# Patient Record
Sex: Female | Born: 1965
Health system: Southern US, Community
[De-identification: ages and names within clinical notes are randomized; demographics above are authoritative.]

## PROBLEM LIST (undated history)

## (undated) DIAGNOSIS — Z789 Other specified health status: Secondary | ICD-10-CM

## (undated) HISTORY — PX: BREAST CYST EXCISION: SHX579

---

## 2007-10-23 ENCOUNTER — Ambulatory Visit: Payer: Self-pay | Admitting: Family Medicine

## 2009-01-25 ENCOUNTER — Ambulatory Visit: Payer: Self-pay | Admitting: Family Medicine

## 2009-07-13 ENCOUNTER — Ambulatory Visit: Payer: Self-pay | Admitting: Family Medicine

## 2009-08-02 ENCOUNTER — Ambulatory Visit: Payer: Self-pay | Admitting: Family Medicine

## 2012-10-22 DIAGNOSIS — R928 Other abnormal and inconclusive findings on diagnostic imaging of breast: Secondary | ICD-10-CM | POA: Insufficient documentation

## 2013-05-05 ENCOUNTER — Ambulatory Visit: Payer: Self-pay | Admitting: Family Medicine

## 2015-02-24 ENCOUNTER — Ambulatory Visit: Payer: Self-pay | Admitting: Family Medicine

## 2015-02-24 ENCOUNTER — Encounter: Payer: Self-pay | Admitting: Family Medicine

## 2015-02-24 ENCOUNTER — Ambulatory Visit (INDEPENDENT_AMBULATORY_CARE_PROVIDER_SITE_OTHER): Payer: No Typology Code available for payment source | Admitting: Family Medicine

## 2015-02-24 VITALS — BP 110/64 | HR 76 | Ht 64.0 in | Wt 156.0 lb

## 2015-02-24 DIAGNOSIS — G44329 Chronic post-traumatic headache, not intractable: Secondary | ICD-10-CM

## 2015-02-24 DIAGNOSIS — M67442 Ganglion, left hand: Secondary | ICD-10-CM | POA: Diagnosis not present

## 2015-02-24 NOTE — Patient Instructions (Signed)

## 2015-02-24 NOTE — Progress Notes (Signed)
Name: Mallory Weber   MRN: [id number]    DOB: 08-06-1965   Date:02/24/2015       Progress Note  Subjective  Chief Complaint  Chief Complaint  Patient presents with  . Headache    in March hit head on water slide. Each time she has a headache- it seems to hurt in that spot.    Headache  This is a recurrent problem. The current episode started more than 1 month ago. The problem occurs intermittently. The problem has been unchanged. The pain is located in the left unilateral and temporal region. The pain does not radiate. The pain quality is not similar to prior headaches. The quality of the pain is described as aching. The pain is at a severity of 5/10. The pain is moderate. Associated symptoms include blurred vision. Pertinent negatives include no abdominal pain, abnormal behavior, anorexia, back pain, coughing, dizziness, ear pain, facial sweating, fever, hearing loss, insomnia, nausea, neck pain, numbness, phonophobia, scalp tenderness, seizures, sinus pressure, sore throat, tingling, tinnitus or weight loss. Nothing aggravates the symptoms. She has tried acetaminophen for the symptoms. The treatment provided mild relief. Her past medical history is significant for recent head traumas.  Hand Pain  There was no injury mechanism. The pain is present in the left fingers. The quality of the pain is described as aching. The pain is mild. Pertinent negatives include no chest pain, numbness or tingling. Nothing aggravates the symptoms. The treatment provided no relief.    No problem-specific assessment & plan notes found for this encounter.   No past medical history on file.  Past Surgical History  Procedure Laterality Date  . Breast cyst excision Bilateral     No family history on file.  Social History   Social History  . Marital Status: Married    Spouse Name: N/A  . Number of Children: N/A  . Years of Education: N/A   Occupational History  . Not on file.   Social History  Main Topics  . Smoking status: Never Smoker   . Smokeless tobacco: Not on file  . Alcohol Use: 0.0 oz/week    0 Standard drinks or equivalent per week  . Drug Use: No  . Sexual Activity: Not on file   Other Topics Concern  . Not on file   Social History Narrative  . No narrative on file    Allergies  Allergen Reactions  . Iodinated Diagnostic Agents Hives  . Cat Hair Extract Itching and Other (See Comments)    Also sneezing & eye irritation     Review of Systems  Constitutional: Negative for fever, chills, weight loss and malaise/fatigue.  HENT: Negative for ear discharge, ear pain, hearing loss, sinus pressure, sore throat and tinnitus.   Eyes: Positive for blurred vision.  Respiratory: Negative for cough, sputum production, shortness of breath and wheezing.   Cardiovascular: Negative for chest pain, palpitations and leg swelling.  Gastrointestinal: Negative for heartburn, nausea, abdominal pain, diarrhea, constipation, blood in stool, melena and anorexia.  Genitourinary: Negative for dysuria, urgency, frequency and hematuria.  Musculoskeletal: Negative for myalgias, back pain, joint pain and neck pain.  Skin: Negative for rash.  Neurological: Positive for headaches. Negative for dizziness, tingling, sensory change, focal weakness, seizures and numbness.  Endo/Heme/Allergies: Negative for environmental allergies and polydipsia. Does not bruise/bleed easily.  Psychiatric/Behavioral: Negative for depression and suicidal ideas. The patient is not nervous/anxious and does not have insomnia.      Objective  Filed Vitals:   02/24/15  1508  BP: 110/64  Pulse: 76  Height: 5\' 4"  (1.626 m)  Weight: 156 lb (70.761 kg)    Physical Exam  Constitutional: She is well-developed, well-nourished, and in no distress. No distress.  HENT:  Head: Normocephalic and atraumatic.  Right Ear: External ear normal.  Left Ear: External ear normal.  Nose: Nose normal.  Mouth/Throat:  Oropharynx is clear and moist.  Eyes: Conjunctivae and EOM are normal. Pupils are equal, round, and reactive to light. Right eye exhibits no discharge. Left eye exhibits no discharge.  Neck: Normal range of motion. Neck supple. No JVD present. No thyromegaly present.  Cardiovascular: Normal rate, regular rhythm, normal heart sounds and intact distal pulses.  Exam reveals no gallop and no friction rub.   No murmur heard. Pulmonary/Chest: Effort normal and breath sounds normal.  Abdominal: Soft. Bowel sounds are normal. She exhibits no mass. There is no tenderness. There is no guarding.  Musculoskeletal: Normal range of motion. She exhibits no edema.  Lymphadenopathy:    She has no cervical adenopathy.  Neurological: She is alert. She has normal reflexes.  Skin: Skin is warm and dry. She is not diaphoretic.  Psychiatric: Mood and affect normal.      Assessment & Plan  Problem List Items Addressed This Visit    None    Visit Diagnoses    Chronic post-traumatic headache, not intractable    -  Primary    Relevant Orders    CT Head Wo Contrast    Ganglion cyst of flexor tendon sheath of finger, left        obs/rtc prn         Dr. [facility]  02/24/2015

## 2015-02-27 ENCOUNTER — Ambulatory Visit
Admission: RE | Admit: 2015-02-27 | Discharge: 2015-02-27 | Disposition: A | Discharge status: Home / Self Care | Payer: No Typology Code available for payment source | Source: Ambulatory Visit | Attending: Family Medicine | Admitting: Family Medicine

## 2015-02-27 DIAGNOSIS — G44329 Chronic post-traumatic headache, not intractable: Secondary | ICD-10-CM | POA: Diagnosis present

## 2015-07-20 DIAGNOSIS — N950 Postmenopausal bleeding: Secondary | ICD-10-CM | POA: Insufficient documentation

## 2016-01-12 ENCOUNTER — Ambulatory Visit (INDEPENDENT_AMBULATORY_CARE_PROVIDER_SITE_OTHER): Payer: Managed Care, Other (non HMO) | Admitting: Family Medicine

## 2016-01-12 ENCOUNTER — Encounter: Payer: Self-pay | Admitting: Family Medicine

## 2016-01-12 VITALS — BP 120/70 | HR 64 | Ht 65.0 in | Wt 156.0 lb

## 2016-01-12 DIAGNOSIS — H6983 Other specified disorders of Eustachian tube, bilateral: Secondary | ICD-10-CM | POA: Diagnosis not present

## 2016-01-12 DIAGNOSIS — K1120 Sialoadenitis, unspecified: Secondary | ICD-10-CM

## 2016-01-12 DIAGNOSIS — S0300XD Dislocation of jaw, unspecified side, subsequent encounter: Secondary | ICD-10-CM

## 2016-01-12 MED ORDER — AZITHROMYCIN 250 MG PO TABS: ORAL_TABLET | ORAL | 0 refills | Status: DC

## 2016-01-12 MED ORDER — ETODOLAC 400 MG PO TABS: 400.0000 mg | ORAL_TABLET | Freq: Two times a day (BID) | ORAL | 3 refills | Status: DC

## 2016-01-12 NOTE — Patient Instructions (Signed)

## 2016-01-12 NOTE — Progress Notes (Signed)
Name: Mallory Weber   MRN: [id number]    DOB: 11-22-1965   Date:01/12/2016       Progress Note  Subjective  Chief Complaint  Chief Complaint  Patient presents with  . Jaw Pain    Hurting on both sides around the backside of jawbone. Pressure in ears- went to dentist/ nothing wrong with teeth    Otalgia   There is pain in both ears. This is a new problem. The current episode started 1 to 4 weeks ago. The problem occurs constantly. The problem has been gradually improving (now at persist level). There has been no fever. The pain is moderate. Pertinent negatives include no abdominal pain, coughing, diarrhea, ear discharge, headaches, hearing loss, neck pain, rash or sore throat. She has tried NSAIDs for the symptoms. The treatment provided mild relief. There is no history of a chronic ear infection or hearing loss.    No problem-specific Assessment & Plan notes found for this encounter.   History reviewed. No pertinent past medical history.  Past Surgical History:  Procedure Laterality Date  . BREAST CYST EXCISION Bilateral     History reviewed. No pertinent family history.  Social History   Social History  . Marital status: Married    Spouse name: N/A  . Number of children: N/A  . Years of education: N/A   Occupational History  . Not on file.   Social History Main Topics  . Smoking status: Never Smoker  . Smokeless tobacco: Not on file  . Alcohol use 0.0 oz/week  . Drug use: No  . Sexual activity: Not on file   Other Topics Concern  . Not on file   Social History Narrative  . No narrative on file    Allergies  Allergen Reactions  . Iodinated Diagnostic Agents Hives  . Cat Hair Extract Itching and Other (See Comments)    Also sneezing & eye irritation     Review of Systems  Constitutional: Negative for chills, fever, malaise/fatigue and weight loss.  HENT: Positive for ear pain. Negative for ear discharge, hearing loss and sore throat.   Eyes: Negative  for blurred vision.  Respiratory: Negative for cough, sputum production, shortness of breath and wheezing.   Cardiovascular: Negative for chest pain, palpitations and leg swelling.  Gastrointestinal: Negative for abdominal pain, blood in stool, constipation, diarrhea, heartburn, melena and nausea.  Genitourinary: Negative for dysuria, frequency, hematuria and urgency.  Musculoskeletal: Negative for back pain, joint pain, myalgias and neck pain.  Skin: Negative for rash.  Neurological: Negative for dizziness, tingling, sensory change, focal weakness and headaches.  Endo/Heme/Allergies: Negative for environmental allergies and polydipsia. Does not bruise/bleed easily.  Psychiatric/Behavioral: Negative for depression and suicidal ideas. The patient is not nervous/anxious and does not have insomnia.      Objective  Vitals:   01/12/16 1357  BP: 120/70  Pulse: 64  Weight: 156 lb (70.8 kg)  Height: 5\' 5"  (1.651 m)    Physical Exam  Constitutional: She is well-developed, well-nourished, and in no distress. No distress.  HENT:  Head: Normocephalic and atraumatic.  Right Ear: External ear normal.  Left Ear: External ear normal.  Nose: Nose normal.  Mouth/Throat: Oropharynx is clear and moist.  Tenderness bilateral YMJ  Eyes: Conjunctivae and EOM are normal. Pupils are equal, round, and reactive to light. Right eye exhibits no discharge. Left eye exhibits no discharge.  Neck: Normal range of motion. Neck supple. No JVD present. No thyromegaly present.  Cardiovascular: Normal rate, regular rhythm,  normal heart sounds and intact distal pulses.  Exam reveals no gallop and no friction rub.   No murmur heard. Pulmonary/Chest: Effort normal and breath sounds normal.  Abdominal: Soft. Bowel sounds are normal. She exhibits no mass. There is no tenderness. There is no guarding.  Musculoskeletal: Normal range of motion. She exhibits no edema.  Lymphadenopathy:    She has no cervical adenopathy.   Neurological: She is alert. She has normal reflexes.  Skin: Skin is warm and dry. She is not diaphoretic.  Psychiatric: Mood and affect normal.  Nursing note and vitals reviewed.     Assessment & Plan  Problem List Items Addressed This Visit    None    Visit Diagnoses    TMJ (dislocation of temporomandibular joint), subsequent encounter    -  Primary   Relevant Medications   etodolac (LODINE) 400 MG tablet   Eustachian tube dysfunction, bilateral       sudafed   Parotiditis       Relevant Medications   azithromycin (ZITHROMAX) 250 MG tablet        Dr. Deanna [facility]  01/12/16

## 2016-02-15 DIAGNOSIS — Z09 Encounter for follow-up examination after completed treatment for conditions other than malignant neoplasm: Secondary | ICD-10-CM | POA: Insufficient documentation

## 2017-03-06 ENCOUNTER — Other Ambulatory Visit: Payer: Self-pay | Admitting: Family Medicine

## 2017-03-06 ENCOUNTER — Encounter: Payer: Self-pay | Admitting: Family Medicine

## 2017-03-06 ENCOUNTER — Ambulatory Visit (INDEPENDENT_AMBULATORY_CARE_PROVIDER_SITE_OTHER): Payer: 59 | Admitting: Family Medicine

## 2017-03-06 VITALS — BP 100/62 | HR 80 | Ht 65.0 in | Wt 162.0 lb

## 2017-03-06 DIAGNOSIS — Z23 Encounter for immunization: Secondary | ICD-10-CM | POA: Diagnosis not present

## 2017-03-06 DIAGNOSIS — R002 Palpitations: Secondary | ICD-10-CM | POA: Diagnosis not present

## 2017-03-06 DIAGNOSIS — R635 Abnormal weight gain: Secondary | ICD-10-CM | POA: Diagnosis not present

## 2017-03-06 NOTE — Progress Notes (Signed)
Name: Mallory Weber   MRN: [id number]    DOB: 06-Oct-1965   Date:03/06/2017       Progress Note  Subjective  Chief Complaint  Chief Complaint  Patient presents with  . Neck Pain    felt like vein on L) side of neck was sore     Palpitations   This is a new problem. The current episode started 1 to 4 weeks ago (2 weeks ago). The problem occurs 2 to 4 times per day. The problem has been gradually improving. Exacerbated by: not ass caff  Pertinent negatives include no anxiety, chest fullness, chest pain, coughing, dizziness, fever, malaise/fatigue, nausea or shortness of breath. She has tried breathing exercises for the symptoms. There is no history of anemia, drug use, heart disease or hyperthyroidism.  Neck Pain   This is a new problem. The current episode started 1 to 4 weeks ago. The problem occurs intermittently. The pain is associated with a sleep position. The quality of the pain is described as aching. The pain is mild. The symptoms are aggravated by position. Pertinent negatives include no chest pain, fever, headaches, tingling or weight loss. She has tried nothing for the symptoms.    No problem-specific Assessment & Plan notes found for this encounter.   No past medical history on file.  Past Surgical History:  Procedure Laterality Date  . BREAST CYST EXCISION Bilateral     No family history on file.  Social History   Social History  . Marital status: Married    Spouse name: N/A  . Number of children: N/A  . Years of education: N/A   Occupational History  . Not on file.   Social History Main Topics  . Smoking status: Never Smoker  . Smokeless tobacco: Never Used  . Alcohol use 0.0 oz/week  . Drug use: No  . Sexual activity: Not on file   Other Topics Concern  . Not on file   Social History Narrative  . No narrative on file    Allergies  Allergen Reactions  . Iodinated Diagnostic Agents Hives  . Cat Hair Extract Itching and Other (See Comments)   Also sneezing & eye irritation    Outpatient Medications Prior to Visit  Medication Sig Dispense Refill  . Multiple Vitamin (MULTI-VITAMINS) TABS Take 1 tablet by mouth daily at 6 (six) AM.    . azithromycin (ZITHROMAX) 250 MG tablet 2 today then 1 a day for 4 day 6 tablet 0  . etodolac (LODINE) 400 MG tablet Take 1 tablet (400 mg total) by mouth 2 (two) times daily. 30 tablet 3   No facility-administered medications prior to visit.     Review of Systems  Constitutional: Negative for chills, fever, malaise/fatigue and weight loss.  HENT: Negative for ear discharge, ear pain and sore throat.   Eyes: Negative for blurred vision.  Respiratory: Negative for cough, sputum production, shortness of breath and wheezing.   Cardiovascular: Negative for chest pain, palpitations and leg swelling.  Gastrointestinal: Negative for abdominal pain, blood in stool, constipation, diarrhea, heartburn, melena and nausea.  Genitourinary: Negative for dysuria, frequency, hematuria and urgency.  Musculoskeletal: Positive for neck pain. Negative for back pain, joint pain and myalgias.  Skin: Negative for rash.  Neurological: Negative for dizziness, tingling, sensory change, focal weakness and headaches.  Endo/Heme/Allergies: Negative for environmental allergies and polydipsia. Does not bruise/bleed easily.  Psychiatric/Behavioral: Negative for depression and suicidal ideas. The patient is not nervous/anxious and does not have insomnia.  Objective  Vitals:   03/06/17 1030  BP: 100/62  Pulse: 80  Weight: 162 lb (73.5 kg)  Height: 5\' 5"  (1.651 m)    Physical Exam  Constitutional: She is well-developed, well-nourished, and in no distress. No distress.  HENT:  Head: Normocephalic and atraumatic.  Right Ear: External ear normal.  Left Ear: External ear normal.  Nose: Nose normal.  Mouth/Throat: Oropharynx is clear and moist.  Eyes: Pupils are equal, round, and reactive to light. Conjunctivae and  EOM are normal. Right eye exhibits no discharge. Left eye exhibits no discharge.  Neck: Normal range of motion. Neck supple. No JVD present. No thyromegaly present.  Cardiovascular: Normal rate, regular rhythm, S1 normal, S2 normal, intact distal pulses and normal pulses.  PMI is not displaced.  Exam reveals no gallop, no S3, no S4 and no friction rub.   Murmur heard.  Systolic murmur is present with a grade of 1/6  ? click  Pulmonary/Chest: Effort normal and breath sounds normal. She has no wheezes. She has no rales.  Abdominal: Soft. Bowel sounds are normal. She exhibits no mass. There is no tenderness. There is no guarding.  Musculoskeletal: Normal range of motion. She exhibits no edema.  Lymphadenopathy:    She has no cervical adenopathy.  Neurological: She is alert. She has normal reflexes.  Skin: Skin is warm and dry. She is not diaphoretic.  Psychiatric: Mood and affect normal.  Nursing note and vitals reviewed.     Assessment & Plan  Problem List Items Addressed This Visit    None    Visit Diagnoses    Palpitations    -  Primary   Relevant Orders   EKG 12-Lead (Completed)   TSH   Ambulatory referral to Cardiology   Weight gain       Relevant Orders   Renal Function Panel   Lipid Profile   Flu vaccine need       Relevant Orders   Flu Vaccine QUAD 6+ mos PF IM (Fluarix Quad PF) (Completed)      No orders of the defined types were placed in this encounter. I spent 30 minutes with this patient, More than 50% of that time was spent in face to face education, counseling and care coordination.    Dr. [facility]  03/06/17

## 2017-03-06 NOTE — Patient Instructions (Signed)
Mitral Valve Prolapse Mitral valve prolapse is a heart condition involving the mitral valve. This is the valve between the upper chamber (atrium) and the lower chamber (ventricle) on the left side of the heart. Normally, the mitral valve allows blood to flow from the atrium to the ventricle and then seals off the chambers from one another. If you have mitral valve prolapse, the valve does not work the way that it should. The flaps of the mitral valve do not form a tight seal between the atrium and the ventricle. When this happens, blood can flow the wrong way (mitral valve regurgitation). This causes symptoms of mitral valve prolapse. This condition can develop if:  The mitral valve flaps are larger and thicker than normal.  The valve opening stretches abnormally.  The valve flaps flop or bulge more than they should.  What are the causes? The cause of this condition is not known. However:  It is sometimes passed down (inherited) from a family member who also had the condition.  It can be a complication of other diseases.  What increases the risk? This condition is more like to develop in people with:  A family history of mitral valve prolapse.  Muscular dystrophy.  Graves disease.  Scoliosis.  A connective tissue disorder.  What are the signs or symptoms? Symptoms of this condition include:  A fast or irregular heartbeat (palpitations).  Fatigue.  Dizziness.  Shortness of breath.  Discomfort in the chest area.  In some cases, there are no symptoms for this condition. How is this diagnosed? This condition may be diagnosed based on:  Your symptoms and medical history.  A physical exam, which includes listening to your heart with a stethoscope.  Other tests to confirm the diagnosis. These may include imaging studies of your heart, such as: ? X-rays. These check for fluid in the lungs. ? Echocardiogram. This test uses sound waves to show the size of your heart and  how well it pumps. ? Doppler ultrasound. This test uses sound waves to take pictures of the blood flow through your valve. ? Electrocardiogram (ECG). This test records the electrical activity of your heart.  How is this treated? Treatment for this condition depends on how severe your symptoms are. If you need treatment, the goal is to relieve symptoms and prevent further problems, such as a type of heart infection called endocarditis. Treatment can include:  Medicines. You may need to take: ? Beta blockers. These help with chest discomfort and palpitations. ? Vasodilators. These improve forward blood flow through the valve, if it is leaking. ? Water pills (diuretics). These get rid of any extra fluid that is present.  Surgery to repair or replace the mitral valve.  If you do not have symptoms, you may not need treatment. Follow these instructions at home:  Take over-the-counter and prescription medicines only as told by your health care provider.  Get regular exercise. Ask your health care provider to recommend some activities that are safe for you to do.  Do not use any products that contain nicotine or tobacco, such as cigarettes and e-cigarettes. If you need help quitting, ask your health care provider.  Avoid being around secondhand smoke.  Brush and floss your teeth every day. See a dentist regularly. Having unhealthy teeth and gums may make endocarditis more likely.  Keep all follow-up visits as told by your health care provider. This is important. Contact a health care provider if:  You have any kind of abnormal heartbeat.  You are often very tired.  You have a cough that will not go away.  Your symptoms of mitral valve prolapse begin to get worse. Get help right away if:  You have chest pain or shortness of breath.  You start to have chills, body aches, and a fever. This information is not intended to replace advice given to you by your health care provider. Make  sure you discuss any questions you have with your health care provider. Document Released: 05/24/2000 Document Revised: 01/23/2016 Document Reviewed: 11/18/2015 Elsevier Interactive Patient Education  2017 [employer].

## 2017-03-07 LAB — RENAL FUNCTION PANEL
Albumin: 4.7 g/dL (ref 3.5–5.5)
BUN / CREAT RATIO: 17 (ref 9–23)
BUN: 13 mg/dL (ref 6–24)
CALCIUM: 9.6 mg/dL (ref 8.7–10.2)
CO2: 24 mmol/L (ref 20–29)
CREATININE: 0.75 mg/dL (ref 0.57–1.00)
Chloride: 101 mmol/L (ref 96–106)
GFR calc Af Amer: 105 mL/min/{1.73_m2} (ref >59)
GFR, EST NON AFRICAN AMERICAN: 91 mL/min/{1.73_m2} (ref >59)
Glucose: 94 mg/dL (ref 65–99)
Phosphorus: 3.7 mg/dL (ref 2.5–4.5)
Potassium: 4.8 mmol/L (ref 3.5–5.2)
SODIUM: 143 mmol/L (ref 134–144)

## 2017-03-07 LAB — LIPID PANEL
CHOL/HDL RATIO: 4 ratio (ref 0.0–4.4)
CHOLESTEROL TOTAL: 195 mg/dL (ref 100–199)
HDL: 49 mg/dL (ref >39)
LDL CALC: 117 mg/dL — AB (ref 0–99)
TRIGLYCERIDES: 143 mg/dL (ref 0–149)
VLDL CHOLESTEROL CAL: 29 mg/dL (ref 5–40)

## 2017-03-07 LAB — TSH: TSH: 1.83 u[IU]/mL (ref 0.450–4.500)

## 2017-04-24 ENCOUNTER — Other Ambulatory Visit: Payer: Self-pay | Admitting: Family Medicine

## 2018-07-16 DIAGNOSIS — Z803 Family history of malignant neoplasm of breast: Secondary | ICD-10-CM | POA: Insufficient documentation

## 2019-08-20 ENCOUNTER — Ambulatory Visit: Payer: No Typology Code available for payment source

## 2019-10-29 ENCOUNTER — Ambulatory Visit: Payer: 59 | Admitting: Family Medicine

## 2019-10-29 ENCOUNTER — Encounter: Payer: Self-pay | Admitting: Family Medicine

## 2019-10-29 ENCOUNTER — Other Ambulatory Visit: Payer: Self-pay

## 2019-10-29 VITALS — BP 120/64 | HR 64 | Ht 65.0 in | Wt 152.0 lb

## 2019-10-29 DIAGNOSIS — R5383 Other fatigue: Secondary | ICD-10-CM

## 2019-10-29 DIAGNOSIS — M5412 Radiculopathy, cervical region: Secondary | ICD-10-CM | POA: Diagnosis not present

## 2019-10-29 DIAGNOSIS — L659 Nonscarring hair loss, unspecified: Secondary | ICD-10-CM

## 2019-10-29 DIAGNOSIS — H6983 Other specified disorders of Eustachian tube, bilateral: Secondary | ICD-10-CM

## 2019-10-29 NOTE — Progress Notes (Signed)
Date:  10/29/2019   Name:  Mallory Weber   DOB:  1966/04/04   MRN:  [id number]   Chief Complaint: Alopecia (wants thyroid checked and cholesterol), ear pressure, and nerve pain (in L) arm)  Thyroid Problem Presents for initial visit. Symptoms include anxiety, dry skin, fatigue, hair loss and weight loss. Patient reports no cold intolerance, constipation, depressed mood, diaphoresis, diarrhea, heat intolerance, hoarse voice, menstrual problem, nail problem, palpitations, tremors, visual change or weight gain. The symptoms have been stable. Past treatments include nothing. The following procedures have not been performed: radioiodine uptake scan, thyroid FNA, thyroid ultrasound and thyroidectomy.  Otalgia  There is pain in the right (annoyanced) ear. This is a new problem. The current episode started in the past 7 days. The problem has been gradually improving. There has been no fever. The pain is mild. Pertinent negatives include no abdominal pain, coughing, diarrhea, ear discharge, headaches, hearing loss, neck pain, rash, rhinorrhea, sore throat or vomiting. There is no history of hearing loss.  Neurologic Problem The patient's primary symptoms include clumsiness. The patient's pertinent negatives include no altered mental status, focal sensory loss, focal weakness, loss of balance, memory loss, near-syncope, slurred speech, syncope, visual change or weakness. This is a new problem. The current episode started more than 1 month ago (6 months). The problem has been waxing and waning since onset. There was left-sided focality noted. Associated symptoms include dizziness and fatigue. Pertinent negatives include no abdominal pain, auditory change, aura, back pain, bladder incontinence, bowel incontinence, chest pain, confusion, diaphoresis, fever, headaches, light-headedness, nausea, neck pain, palpitations, shortness of breath, vertigo or vomiting. The treatment provided mild relief.   Anxiety Presents for initial visit. Symptoms include dizziness, irritability and nervous/anxious behavior. Patient reports no chest pain, confusion, depressed mood, excessive worry, insomnia, nausea, palpitations, panic or shortness of breath. Symptoms occur occasionally.      Lab Results  Component Value Date   CREATININE 0.75 03/06/2017   BUN 13 03/06/2017   NA 143 03/06/2017   K 4.8 03/06/2017   CL 101 03/06/2017   CO2 24 03/06/2017   Lab Results  Component Value Date   CHOL 195 03/06/2017   HDL 49 03/06/2017   LDLCALC 117 (H) 03/06/2017   TRIG 143 03/06/2017   CHOLHDL 4.0 03/06/2017   Lab Results  Component Value Date   TSH 1.830 03/06/2017   No results found for: HGBA1C No results found for: WBC, HGB, HCT, MCV, PLT No results found for: ALT, AST, GGT, ALKPHOS, BILITOT   Review of Systems  Constitutional: Positive for fatigue, irritability and weight loss. Negative for chills, diaphoresis, fever, unexpected weight change and weight gain.  HENT: Positive for ear pain. Negative for congestion, ear discharge, hearing loss, hoarse voice, rhinorrhea, sinus pressure, sneezing and sore throat.   Eyes: Negative for photophobia, pain, discharge, redness and itching.  Respiratory: Negative for cough, shortness of breath, wheezing and stridor.   Cardiovascular: Negative for chest pain, palpitations and near-syncope.  Gastrointestinal: Negative for abdominal pain, blood in stool, bowel incontinence, constipation, diarrhea, nausea and vomiting.  Endocrine: Negative for cold intolerance, heat intolerance, polydipsia, polyphagia and polyuria.  Genitourinary: Negative for bladder incontinence, dysuria, flank pain, frequency, hematuria, menstrual problem, pelvic pain, urgency, vaginal bleeding and vaginal discharge.  Musculoskeletal: Negative for arthralgias, back pain, myalgias and neck pain.  Skin: Negative for rash.  Allergic/Immunologic: Negative for environmental allergies and  food allergies.  Neurological: Positive for dizziness. Negative for vertigo, tremors, focal weakness, syncope, weakness,  light-headedness, numbness, headaches and loss of balance.  Hematological: Negative for adenopathy. Does not bruise/bleed easily.  Psychiatric/Behavioral: Negative for confusion, dysphoric mood and memory loss. The patient is nervous/anxious. The patient does not have insomnia.     There are no problems to display for this patient.   Allergies  Allergen Reactions  . Iodinated Diagnostic Agents Hives  . Cat Hair Extract Itching and Other (See Comments)    Also sneezing & eye irritation    Past Surgical History:  Procedure Laterality Date  . BREAST CYST EXCISION Bilateral     Social History   Tobacco Use  . Smoking status: Never Smoker  . Smokeless tobacco: Never Used  Substance Use Topics  . Alcohol use: Yes    Alcohol/week: 0.0 standard drinks  . Drug use: No     Medication list has been reviewed and updated.  Current Meds  Medication Sig  . Multiple Vitamin (MULTI-VITAMINS) TABS Take 1 tablet by mouth daily at 6 (six) AM.    PHQ 2/9 Scores 10/29/2019 03/06/2017 03/06/2017  PHQ - 2 Score 0 0 0  PHQ- 9 Score 3 1 -    BP Readings from Last 3 Encounters:  10/29/19 120/64  03/06/17 100/62  01/12/16 120/70    Physical Exam Vitals and nursing note reviewed.  Constitutional:      Appearance: She is well-developed.  HENT:     Head: Normocephalic.     Right Ear: Tympanic membrane, ear canal and external ear normal.     Left Ear: Tympanic membrane, ear canal and external ear normal.     Nose: Nose normal. No congestion or rhinorrhea.  Eyes:     General: Lids are everted, no foreign bodies appreciated. No scleral icterus.       Left eye: No foreign body or hordeolum.     Conjunctiva/sclera: Conjunctivae normal.     Right eye: Right conjunctiva is not injected.     Left eye: Left conjunctiva is not injected.     Pupils: Pupils are equal, round,  and reactive to light.  Neck:     Thyroid: No thyromegaly.     Vascular: No JVD.     Trachea: No tracheal deviation.  Cardiovascular:     Rate and Rhythm: Normal rate and regular rhythm.     Heart sounds: Normal heart sounds. No murmur. No friction rub. No gallop.   Pulmonary:     Effort: Pulmonary effort is normal. No respiratory distress.     Breath sounds: Normal breath sounds. No wheezing, rhonchi or rales.  Abdominal:     General: Bowel sounds are normal.     Palpations: Abdomen is soft. There is no mass.     Tenderness: There is no abdominal tenderness. There is no guarding or rebound.  Musculoskeletal:        General: No tenderness. Normal range of motion.     Cervical back: Normal range of motion and neck supple.  Lymphadenopathy:     Cervical: No cervical adenopathy.  Skin:    General: Skin is warm.     Capillary Refill: Capillary refill takes 2 to 3 seconds.     Coloration: Skin is pale.     Findings: No rash.  Neurological:     Mental Status: She is alert and oriented to person, place, and time.     Cranial Nerves: No cranial nerve deficit.     Deep Tendon Reflexes: Reflexes normal.  Psychiatric:        Mood and  Affect: Mood is not anxious or depressed.     Wt Readings from Last 3 Encounters:  10/29/19 152 lb (68.9 kg)  03/06/17 162 lb (73.5 kg)  01/12/16 156 lb (70.8 kg)    BP 120/64   Pulse 64   Ht 5\' 5"  (1.651 m)   Wt 152 lb (68.9 kg)   BMI 25.29 kg/m   Assessment and Plan: 1. Fatigue, unspecified type Chronic.  Persistent.  Patient is also noted other symptoms could be indicative of thyroid concern.  We will begin evaluation for fatigue work-up with CBC thyroid stimulating hormone hepatic function panel and renal function panel. - Renal function panel - Hepatic Function Panel (6) - TSH - CBC with Differential/Platelet  2. Alopecia Has been noted by the patient's physicians that there is thinning of her hair.  Patient has been using female dosing  of Rogaine for about a month and has not noticed significant results yet have encouraged patient is to continue at this point that it takes a couple months to see the full effect since early growth is bilious and not recognizable. - TSH  3. Cervical radiculopathy Patient's had discomfort going from the shoulder down her left arm associated with cervical pain this is consistent with radiculopathy.  Neurologic exam is unremarkable.  Patient may take anti-inflammatories as needed.  4. Dysfunction of both eustachian tubes Patient has noticed a heartbeat in her right ear.  Exam of both ears is unremarkable other than eustachian tube dysfunction manifested by retracted tympanic membranes.  There is no resolution when patient clears.  However this does continue I would suggest starting on nasal steroid.

## 2019-10-29 NOTE — Patient Instructions (Signed)
Eustachian Tube Dysfunction ° °Eustachian tube dysfunction refers to a condition in which a blockage develops in the narrow passage that connects the middle ear to the back of the nose (eustachian tube). The eustachian tube regulates air pressure in the middle ear by letting air move between the ear and nose. It also helps to drain fluid from the middle ear space. °Eustachian tube dysfunction can affect one or both ears. When the eustachian tube does not function properly, air pressure, fluid, or both can build up in the middle ear. °What are the causes? °This condition occurs when the eustachian tube becomes blocked or cannot open normally. Common causes of this condition include: °· Ear infections. °· Colds and other infections that affect the nose, mouth, and throat (upper respiratory tract). °· Allergies. °· Irritation from cigarette smoke. °· Irritation from stomach acid coming up into the esophagus (gastroesophageal reflux). The esophagus is the tube that carries food from the mouth to the stomach. °· Sudden changes in air pressure, such as from descending in an airplane or scuba diving. °· Abnormal growths in the nose or throat, such as: °? Growths that line the nose (nasal polyps). °? Abnormal growth of cells (tumors). °? Enlarged tissue at the back of the throat (adenoids). °What increases the risk? °You are more likely to develop this condition if: °· You smoke. °· You are overweight. °· You are a child who has: °? Certain birth defects of the mouth, such as cleft palate. °? Large tonsils or adenoids. °What are the signs or symptoms? °Common symptoms of this condition include: °· A feeling of fullness in the ear. °· Ear pain. °· Clicking or popping noises in the ear. °· Ringing in the ear. °· Hearing loss. °· Loss of balance. °· Dizziness. °Symptoms may get worse when the air pressure around you changes, such as when you travel to an area of high elevation, fly on an airplane, or go scuba diving. °How is  this diagnosed? °This condition may be diagnosed based on: °· Your symptoms. °· A physical exam of your ears, nose, and throat. °· Tests, such as those that measure: °? The movement of your eardrum (tympanogram). °? Your hearing (audiometry). °How is this treated? °Treatment depends on the cause and severity of your condition. °· In mild cases, you may relieve your symptoms by moving air into your ears. This is called "popping the ears." °· In more severe cases, or if you have symptoms of fluid in your ears, treatment may include: °? Medicines to relieve congestion (decongestants). °? Medicines that treat allergies (antihistamines). °? Nasal sprays or ear drops that contain medicines that reduce swelling (steroids). °? A procedure to drain the fluid in your eardrum (myringotomy). In this procedure, a small tube is placed in the eardrum to: °§ Drain the fluid. °§ Restore the air in the middle ear space. °? A procedure to insert a balloon device through the nose to inflate the opening of the eustachian tube (balloon dilation). °Follow these instructions at home: °Lifestyle °· Do not do any of the following until your health care provider approves: °? Travel to high altitudes. °? Fly in airplanes. °? Work in a pressurized cabin or room. °? Scuba dive. °· Do not use any products that contain nicotine or tobacco, such as cigarettes and e-cigarettes. If you need help quitting, ask your health care provider. °· Keep your ears dry. Wear fitted earplugs during showering and bathing. Dry your ears completely after. °General instructions °· Take over-the-counter   and prescription medicines only as told by your health care provider. °· Use techniques to help pop your ears as recommended by your health care provider. These may include: °? Chewing gum. °? Yawning. °? Frequent, forceful swallowing. °? Closing your mouth, holding your nose closed, and gently blowing as if you are trying to blow air out of your nose. °· Keep all  follow-up visits as told by your health care provider. This is important. °Contact a health care provider if: °· Your symptoms do not go away after treatment. °· Your symptoms come back after treatment. °· You are unable to pop your ears. °· You have: °? A fever. °? Pain in your ear. °? Pain in your head or neck. °? Fluid draining from your ear. °· Your hearing suddenly changes. °· You become very dizzy. °· You lose your balance. °Summary °· Eustachian tube dysfunction refers to a condition in which a blockage develops in the eustachian tube. °· It can be caused by ear infections, allergies, inhaled irritants, or abnormal growths in the nose or throat. °· Symptoms include ear pain, hearing loss, or ringing in the ears. °· Mild cases are treated with maneuvers to unblock the ears, such as yawning or ear popping. °· Severe cases are treated with medicines. Surgery may also be done (rare). °This information is not intended to replace advice given to you by your health care provider. Make sure you discuss any questions you have with your health care provider. °Document Revised: 09/16/2017 Document Reviewed: 09/16/2017 °Elsevier Patient Education © 2020 [employer] ° °

## 2019-10-30 LAB — CBC WITH DIFFERENTIAL/PLATELET
Basophils Absolute: 0 10*3/uL (ref 0.0–0.2)
Basos: 1 %
EOS (ABSOLUTE): 0.1 10*3/uL (ref 0.0–0.4)
Eos: 2 %
Hematocrit: 42.2 % (ref 34.0–46.6)
Hemoglobin: 13.7 g/dL (ref 11.1–15.9)
Immature Grans (Abs): 0 10*3/uL (ref 0.0–0.1)
Immature Granulocytes: 0 %
Lymphocytes Absolute: 1.2 10*3/uL (ref 0.7–3.1)
Lymphs: 29 %
MCH: 30.2 pg (ref 26.6–33.0)
MCHC: 32.5 g/dL (ref 31.5–35.7)
MCV: 93 fL (ref 79–97)
Monocytes Absolute: 0.4 10*3/uL (ref 0.1–0.9)
Monocytes: 8 %
Neutrophils Absolute: 2.6 10*3/uL (ref 1.4–7.0)
Neutrophils: 60 %
Platelets: 206 10*3/uL (ref 150–450)
RBC: 4.54 x10E6/uL (ref 3.77–5.28)
RDW: 12.8 % (ref 11.7–15.4)
WBC: 4.3 10*3/uL (ref 3.4–10.8)

## 2019-10-30 LAB — RENAL FUNCTION PANEL
Albumin: 4.6 g/dL (ref 3.8–4.9)
BUN/Creatinine Ratio: 17 (ref 9–23)
BUN: 13 mg/dL (ref 6–24)
CO2: 25 mmol/L (ref 20–29)
Calcium: 9.9 mg/dL (ref 8.7–10.2)
Chloride: 101 mmol/L (ref 96–106)
Creatinine, Ser: 0.78 mg/dL (ref 0.57–1.00)
GFR calc Af Amer: 98 mL/min/{1.73_m2} (ref >59)
GFR calc non Af Amer: 85 mL/min/{1.73_m2} (ref >59)
Glucose: 115 mg/dL — ABNORMAL HIGH (ref 65–99)
Phosphorus: 3.8 mg/dL (ref 3.0–4.3)
Potassium: 4.7 mmol/L (ref 3.5–5.2)
Sodium: 143 mmol/L (ref 134–144)

## 2019-10-30 LAB — HEPATIC FUNCTION PANEL (6)
ALT: 12 IU/L (ref 0–32)
AST: 16 IU/L (ref 0–40)
Alkaline Phosphatase: 60 IU/L (ref 48–121)
Bilirubin Total: 0.3 mg/dL (ref 0.0–1.2)
Bilirubin, Direct: 0.09 mg/dL (ref 0.00–0.40)

## 2019-10-30 LAB — TSH: TSH: 1.35 u[IU]/mL (ref 0.450–4.500)

## 2020-04-24 LAB — HM PAP SMEAR: HM Pap smear: NORMAL

## 2020-04-24 LAB — RESULTS CONSOLE HPV: CHL HPV: NEGATIVE

## 2020-08-15 DIAGNOSIS — M722 Plantar fascial fibromatosis: Secondary | ICD-10-CM | POA: Diagnosis not present

## 2020-09-05 DIAGNOSIS — M722 Plantar fascial fibromatosis: Secondary | ICD-10-CM | POA: Diagnosis not present

## 2020-09-05 DIAGNOSIS — B070 Plantar wart: Secondary | ICD-10-CM | POA: Diagnosis not present

## 2020-09-05 DIAGNOSIS — M79671 Pain in right foot: Secondary | ICD-10-CM | POA: Diagnosis not present

## 2020-09-19 ENCOUNTER — Encounter: Payer: Self-pay | Admitting: Family Medicine

## 2020-09-19 ENCOUNTER — Other Ambulatory Visit: Payer: Self-pay

## 2020-09-19 ENCOUNTER — Ambulatory Visit: Payer: BC Managed Care – PPO | Admitting: Family Medicine

## 2020-09-19 VITALS — BP 100/70 | HR 80 | Ht 65.0 in | Wt 158.0 lb

## 2020-09-19 DIAGNOSIS — D171 Benign lipomatous neoplasm of skin and subcutaneous tissue of trunk: Secondary | ICD-10-CM | POA: Diagnosis not present

## 2020-09-19 DIAGNOSIS — Z780 Asymptomatic menopausal state: Secondary | ICD-10-CM

## 2020-09-19 NOTE — Patient Instructions (Signed)
Lipoma  A lipoma is a noncancerous (benign) tumor that is made up of fat cells. This is a very common type of soft-tissue growth. Lipomas are usually found under the skin (subcutaneous). They may occur in any tissue of the body that contains fat. Common areas for lipomas to appear include the back, arms, shoulders, buttocks, and thighs. Lipomas grow slowly, and they are usually painless. Most lipomas do not cause problems and do not require treatment. What are the causes? The cause of this condition is not known. What increases the risk? You are more likely to develop this condition if:  You are 40-60 years old.  You have a family history of lipomas. What are the signs or symptoms? A lipoma usually appears as a small, round bump under the skin. In most cases, the lump will:  Feel soft or rubbery.  Not cause pain or other symptoms. However, if a lipoma is located in an area where it pushes on nerves, it can become painful or cause other symptoms. How is this diagnosed? A lipoma can usually be diagnosed with a physical exam. You may also have tests to confirm the diagnosis and to rule out other conditions. Tests may include:  Imaging tests, such as a CT scan or an MRI.  Removal of a tissue sample to be looked at under a microscope (biopsy). How is this treated? Treatment for this condition depends on the size of the lipoma and whether it is causing any symptoms.  For small lipomas that are not causing problems, no treatment is needed.  If a lipoma is bigger or it causes problems, surgery may be done to remove the lipoma. Lipomas can also be removed to improve appearance. Most often, the procedure is done after applying a medicine that numbs the area (local anesthetic).  Liposuction may be done to reduce the size of the lipoma before it is removed through surgery, or it may be done to remove the lipoma. Lipomas are removed with this method in order to limit incision size and scarring. A  liposuction tube is inserted through a small incision into the lipoma, and the contents of the lipoma are removed through the tube with suction. Follow these instructions at home:  Watch your lipoma for any changes.  Keep all follow-up visits as told by your health care provider. This is important. Contact a health care provider if:  Your lipoma becomes larger or hard.  Your lipoma becomes painful, red, or increasingly swollen. These could be signs of infection or a more serious condition. Get help right away if:  You develop tingling or numbness in an area near the lipoma. This could indicate that the lipoma is causing nerve damage. Summary  A lipoma is a noncancerous tumor that is made up of fat cells.  Most lipomas do not cause problems and do not require treatment.  If a lipoma is bigger or it causes problems, surgery may be done to remove the lipoma.  Contact a health care provider if your lipoma becomes larger or hard, or if it becomes painful, red, or increasingly swollen. Pain, redness, and swelling could be signs of infection or a more serious condition. This information is not intended to replace advice given to you by your health care provider. Make sure you discuss any questions you have with your health care provider. Document Revised: 01/11/2019 Document Reviewed: 01/11/2019 Elsevier Patient Education  2021 Else[employer] Inc.  

## 2020-09-19 NOTE — Progress Notes (Signed)
Date:  09/19/2020   Name:  Mallory Weber   DOB:  1965/06/19   MRN:  [id number]   Chief Complaint: lump on back (Feels like they are "cysts"- mobile and thinks they are pressing on something)  Leg Pain  The incident occurred more than 1 week ago. There was no injury mechanism. The pain is present in the left foot. The pain is moderate. The pain has been intermittent since onset. Pertinent negatives include no inability to bear weight, loss of motion, loss of sensation, muscle weakness, numbness or tingling. She reports no foreign bodies present. The treatment provided mild relief.    Lab Results  Component Value Date   CREATININE 0.78 10/29/2019   BUN 13 10/29/2019   NA 143 10/29/2019   K 4.7 10/29/2019   CL 101 10/29/2019   CO2 25 10/29/2019   Lab Results  Component Value Date   CHOL 195 03/06/2017   HDL 49 03/06/2017   LDLCALC 117 (H) 03/06/2017   TRIG 143 03/06/2017   CHOLHDL 4.0 03/06/2017   Lab Results  Component Value Date   TSH 1.350 10/29/2019   No results found for: HGBA1C Lab Results  Component Value Date   WBC 4.3 10/29/2019   HGB 13.7 10/29/2019   HCT 42.2 10/29/2019   MCV 93 10/29/2019   PLT 206 10/29/2019   Lab Results  Component Value Date   ALT 12 10/29/2019   AST 16 10/29/2019   ALKPHOS 60 10/29/2019   BILITOT 0.3 10/29/2019     Review of Systems  Constitutional: Negative.  Negative for chills, fatigue, fever and unexpected weight change.  HENT: Negative for congestion, ear discharge, ear pain, rhinorrhea, sinus pressure, sneezing and sore throat.   Eyes: Negative for photophobia, pain, discharge, redness and itching.  Respiratory: Negative for cough, shortness of breath, wheezing and stridor.   Cardiovascular: Negative for chest pain, palpitations and leg swelling.  Gastrointestinal: Negative for abdominal pain, blood in stool, constipation, diarrhea, nausea and vomiting.  Endocrine: Negative for cold intolerance, heat intolerance,  polydipsia, polyphagia and polyuria.  Genitourinary: Negative for dysuria, flank pain, frequency, hematuria, menstrual problem, pelvic pain, urgency, vaginal bleeding and vaginal discharge.  Musculoskeletal: Negative for arthralgias, back pain and myalgias.  Skin: Negative for rash.  Allergic/Immunologic: Negative for environmental allergies and food allergies.  Neurological: Negative for dizziness, tingling, weakness, light-headedness, numbness and headaches.  Hematological: Negative for adenopathy. Does not bruise/bleed easily.  Psychiatric/Behavioral: Negative for dysphoric mood. The patient is not nervous/anxious.     There are no problems to display for this patient.   Allergies  Allergen Reactions  . Iodinated Diagnostic Agents Hives  . Cat Hair Extract Itching and Other (See Comments)    Also sneezing & eye irritation    Past Surgical History:  Procedure Laterality Date  . BREAST CYST EXCISION Bilateral     Social History   Tobacco Use  . Smoking status: Never Smoker  . Smokeless tobacco: Never Used  Substance Use Topics  . Alcohol use: Yes    Alcohol/week: 0.0 standard drinks  . Drug use: No     Medication list has been reviewed and updated.  Current Meds  Medication Sig  . Calcium Carb-Cholecalciferol (CALCIUM 1000 + D PO) Take 1 capsule by mouth daily.  . Minoxidil (ROGAINE WOMENS EX) Apply topically. otc  . Multiple Vitamin (MULTI-VITAMINS) TABS Take 1 tablet by mouth daily at 6 (six) AM.    PHQ 2/9 Scores 09/19/2020 10/29/2019 03/06/2017 03/06/2017  PHQ -  2 Score 0 0 0 0  PHQ- 9 Score 0 3 1 -    GAD 7 : Generalized Anxiety Score 09/19/2020 10/29/2019  Nervous, Anxious, on Edge 0 0  Control/stop worrying 1 0  Worry too much - different things 1 0  Trouble relaxing 0 0  Restless 0 0  Easily annoyed or irritable 0 0  Afraid - awful might happen 0 0  Total GAD 7 Score 2 0  Anxiety Difficulty Not difficult at all -    BP Readings from Last 3 Encounters:   09/19/20 100/70  10/29/19 120/64  03/06/17 100/62    Physical Exam Vitals and nursing note reviewed.  Constitutional:      Appearance: She is well-developed.  HENT:     Head: Normocephalic.     Right Ear: Tympanic membrane, ear canal and external ear normal. There is no impacted cerumen.     Left Ear: Tympanic membrane, ear canal and external ear normal. There is no impacted cerumen.     Nose: Nose normal.     Mouth/Throat:     Mouth: Mucous membranes are moist.  Eyes:     General: Lids are everted, no foreign bodies appreciated. No scleral icterus.       Left eye: No foreign body or hordeolum.     Conjunctiva/sclera: Conjunctivae normal.     Right eye: Right conjunctiva is not injected.     Left eye: Left conjunctiva is not injected.     Pupils: Pupils are equal, round, and reactive to light.  Neck:     Thyroid: No thyromegaly.     Vascular: No JVD.     Trachea: No tracheal deviation.  Cardiovascular:     Rate and Rhythm: Normal rate and regular rhythm.     Heart sounds: Normal heart sounds. No murmur heard. No friction rub. No gallop.   Pulmonary:     Effort: Pulmonary effort is normal. No respiratory distress.     Breath sounds: Normal breath sounds. No wheezing, rhonchi or rales.  Abdominal:     General: Bowel sounds are normal.     Palpations: Abdomen is soft. There is no mass.     Tenderness: There is no abdominal tenderness. There is no guarding or rebound.  Musculoskeletal:        General: No tenderness. Normal range of motion.     Cervical back: Normal range of motion and neck supple.       Back:     Comments: Two palpable areas /nontender/ smooth smaller superior/1.5 cm//lower 3 cm  Lymphadenopathy:     Cervical: No cervical adenopathy.  Skin:    General: Skin is warm.     Findings: No rash.  Neurological:     Mental Status: She is alert and oriented to person, place, and time.     Cranial Nerves: No cranial nerve deficit.     Deep Tendon Reflexes:  Reflexes normal.  Psychiatric:        Mood and Affect: Mood is not anxious or depressed.     Wt Readings from Last 3 Encounters:  09/19/20 158 lb (71.7 kg)  10/29/19 152 lb (68.9 kg)  03/06/17 162 lb (73.5 kg)    BP 100/70   Pulse 80   Ht 5\' 5"  (1.651 m)   Wt 158 lb (71.7 kg)   BMI 26.29 kg/m   Assessment and Plan:  1. Postmenopausal Chronic.  Controlled.  Stable.  Patient is currently having some foot discomfort followed by podiatry.  Apparently there was a x-ray that demonstrated some decrease in mineralization suggesting maybe osteopenia versus osteoporosis.  We will obtain a bone density in the meantime I have discussed increasing vitamin D to 800 units supplementation and including increase of calcium to 1200 mg as well. - DG Bone Density; Future  2. Lipoma of torso Patient has noticed an area that is consistent with a lipoma in the lumbar torso area.  There are 2 larger 1 more inferior to a smaller 1.  They are palpable and nontender but they are causing discomfort to the patient pending her positioning.  She would like to seek evaluation of these with possible removal and I have recommended general surgery for this she will be calling me with a suggested surgeon and we will proceed with referral at that time.

## 2020-09-20 ENCOUNTER — Telehealth: Payer: Self-pay

## 2020-09-20 DIAGNOSIS — Z0000 Encounter for general adult medical examination without abnormal findings: Secondary | ICD-10-CM | POA: Diagnosis not present

## 2020-09-20 DIAGNOSIS — D171 Benign lipomatous neoplasm of skin and subcutaneous tissue of trunk: Secondary | ICD-10-CM

## 2020-09-20 DIAGNOSIS — Z90710 Acquired absence of both cervix and uterus: Secondary | ICD-10-CM | POA: Insufficient documentation

## 2020-09-20 NOTE — Telephone Encounter (Signed)
Copied from [place] [phone]. Topic: Referral - Request for Referral >> Sep 20, 2020  1:58 PM Celene Kras wrote: Has patient seen PCP for this complaint? Yes.   *If NO, is insurance requiring patient see PCP for this issue before PCP can refer them? Referral for which specialty: Mammography Preferred provider/office: [facility]  Reason for referral: Pt states that she is needing to have a bone density scan. Pt states that she has contacted center and that they do not have a referral. Please advise .

## 2020-09-21 ENCOUNTER — Other Ambulatory Visit: Payer: Self-pay

## 2020-09-21 ENCOUNTER — Other Ambulatory Visit: Payer: Self-pay | Admitting: Family Medicine

## 2020-09-21 ENCOUNTER — Telehealth: Payer: Self-pay

## 2020-09-21 DIAGNOSIS — Z780 Asymptomatic menopausal state: Secondary | ICD-10-CM

## 2020-09-21 NOTE — Telephone Encounter (Signed)
Call and schedule a physical with her. She wants a mammo and bone density- if she has a GYN, she will need to see them

## 2020-09-21 NOTE — Telephone Encounter (Signed)
Copied from [place] [phone]. Topic: General - Other >> Sep 21, 2020  9:09 AM Alanda Slim E wrote: Reason for CRM: Pt returning Tara's call and stated pt got Tara's vm but theres more Baxter Flattery needs to know and she still cant schedule an appt/ please advise

## 2020-09-26 LAB — FECAL OCCULT BLOOD, IMMUNOCHEMICAL: IFOBT: NEGATIVE

## 2020-09-26 LAB — EXTERNAL GENERIC LAB PROCEDURE: COLOGUARD: NEGATIVE

## 2020-09-27 ENCOUNTER — Ambulatory Visit
Admission: RE | Admit: 2020-09-27 | Discharge: 2020-09-27 | Disposition: A | Discharge status: Home / Self Care | Payer: BC Managed Care – PPO | Source: Ambulatory Visit | Attending: Family Medicine | Admitting: Family Medicine

## 2020-09-27 ENCOUNTER — Other Ambulatory Visit: Payer: Self-pay

## 2020-09-27 DIAGNOSIS — Z780 Asymptomatic menopausal state: Secondary | ICD-10-CM

## 2020-09-27 DIAGNOSIS — M85852 Other specified disorders of bone density and structure, left thigh: Secondary | ICD-10-CM | POA: Diagnosis not present

## 2020-10-03 DIAGNOSIS — M722 Plantar fascial fibromatosis: Secondary | ICD-10-CM | POA: Diagnosis not present

## 2020-10-03 DIAGNOSIS — B070 Plantar wart: Secondary | ICD-10-CM | POA: Diagnosis not present

## 2020-10-03 DIAGNOSIS — M79671 Pain in right foot: Secondary | ICD-10-CM | POA: Diagnosis not present

## 2020-10-12 ENCOUNTER — Encounter: Payer: Self-pay | Admitting: Family Medicine

## 2020-10-12 ENCOUNTER — Telehealth: Payer: Self-pay

## 2020-10-12 NOTE — Telephone Encounter (Signed)
Sent referral as requested by pt to [facility] doctor Will E.

## 2020-10-12 NOTE — Telephone Encounter (Signed)
Copied from [place] [phone]. Topic: Referral - Status >> Oct 12, 2020  1:41 PM Yvette Rack wrote: Reason for CRM: Pt stated she needs to speak with Dr. Ronnald Ramp in regards to a referral request. Offered to take the information but pt declined and stated she rather speak with Dr. Ronnald Ramp because it is complicated and she had issues previously. Pt requests call back. Cb# [phone]

## 2020-10-18 DIAGNOSIS — M722 Plantar fascial fibromatosis: Secondary | ICD-10-CM | POA: Diagnosis not present

## 2020-10-18 DIAGNOSIS — M79671 Pain in right foot: Secondary | ICD-10-CM | POA: Diagnosis not present

## 2020-10-18 DIAGNOSIS — B070 Plantar wart: Secondary | ICD-10-CM | POA: Diagnosis not present

## 2020-10-26 DIAGNOSIS — D481 Neoplasm of uncertain behavior of connective and other soft tissue: Secondary | ICD-10-CM | POA: Diagnosis not present

## 2020-10-26 DIAGNOSIS — M79605 Pain in left leg: Secondary | ICD-10-CM | POA: Diagnosis not present

## 2020-10-26 DIAGNOSIS — M47816 Spondylosis without myelopathy or radiculopathy, lumbar region: Secondary | ICD-10-CM | POA: Diagnosis not present

## 2020-10-30 DIAGNOSIS — M47816 Spondylosis without myelopathy or radiculopathy, lumbar region: Secondary | ICD-10-CM | POA: Diagnosis not present

## 2020-10-30 DIAGNOSIS — D481 Neoplasm of uncertain behavior of connective and other soft tissue: Secondary | ICD-10-CM | POA: Diagnosis not present

## 2020-11-01 DIAGNOSIS — M79671 Pain in right foot: Secondary | ICD-10-CM | POA: Diagnosis not present

## 2020-11-01 DIAGNOSIS — B070 Plantar wart: Secondary | ICD-10-CM | POA: Diagnosis not present

## 2020-11-01 DIAGNOSIS — M722 Plantar fascial fibromatosis: Secondary | ICD-10-CM | POA: Diagnosis not present

## 2020-11-22 DIAGNOSIS — M722 Plantar fascial fibromatosis: Secondary | ICD-10-CM | POA: Diagnosis not present

## 2020-11-22 DIAGNOSIS — B070 Plantar wart: Secondary | ICD-10-CM | POA: Diagnosis not present

## 2020-11-22 DIAGNOSIS — M25774 Osteophyte, right foot: Secondary | ICD-10-CM | POA: Diagnosis not present

## 2020-12-06 DIAGNOSIS — B070 Plantar wart: Secondary | ICD-10-CM | POA: Diagnosis not present

## 2020-12-06 DIAGNOSIS — M722 Plantar fascial fibromatosis: Secondary | ICD-10-CM | POA: Diagnosis not present

## 2020-12-15 DIAGNOSIS — M722 Plantar fascial fibromatosis: Secondary | ICD-10-CM | POA: Diagnosis not present

## 2020-12-15 DIAGNOSIS — B070 Plantar wart: Secondary | ICD-10-CM | POA: Diagnosis not present

## 2021-01-02 DIAGNOSIS — M722 Plantar fascial fibromatosis: Secondary | ICD-10-CM | POA: Diagnosis not present

## 2021-01-02 DIAGNOSIS — B070 Plantar wart: Secondary | ICD-10-CM | POA: Diagnosis not present

## 2021-01-17 DIAGNOSIS — Z20822 Contact with and (suspected) exposure to covid-19: Secondary | ICD-10-CM | POA: Diagnosis not present

## 2021-01-18 DIAGNOSIS — B070 Plantar wart: Secondary | ICD-10-CM | POA: Diagnosis not present

## 2021-01-18 DIAGNOSIS — M722 Plantar fascial fibromatosis: Secondary | ICD-10-CM | POA: Diagnosis not present

## 2021-01-23 DIAGNOSIS — Z803 Family history of malignant neoplasm of breast: Secondary | ICD-10-CM | POA: Diagnosis not present

## 2021-01-23 DIAGNOSIS — Z1231 Encounter for screening mammogram for malignant neoplasm of breast: Secondary | ICD-10-CM | POA: Diagnosis not present

## 2021-01-23 DIAGNOSIS — Z6826 Body mass index (BMI) 26.0-26.9, adult: Secondary | ICD-10-CM | POA: Diagnosis not present

## 2021-01-23 LAB — HM MAMMOGRAPHY

## 2021-01-31 DIAGNOSIS — H35363 Drusen (degenerative) of macula, bilateral: Secondary | ICD-10-CM | POA: Diagnosis not present

## 2021-02-08 DIAGNOSIS — B070 Plantar wart: Secondary | ICD-10-CM | POA: Diagnosis not present

## 2021-02-08 DIAGNOSIS — M722 Plantar fascial fibromatosis: Secondary | ICD-10-CM | POA: Diagnosis not present

## 2021-02-08 DIAGNOSIS — M2170 Unequal limb length (acquired), unspecified site: Secondary | ICD-10-CM | POA: Diagnosis not present

## 2021-02-16 DIAGNOSIS — Z6826 Body mass index (BMI) 26.0-26.9, adult: Secondary | ICD-10-CM | POA: Diagnosis not present

## 2021-02-16 DIAGNOSIS — R102 Pelvic and perineal pain: Secondary | ICD-10-CM | POA: Diagnosis not present

## 2021-02-20 DIAGNOSIS — R102 Pelvic and perineal pain: Secondary | ICD-10-CM | POA: Diagnosis not present

## 2021-02-20 DIAGNOSIS — D252 Subserosal leiomyoma of uterus: Secondary | ICD-10-CM | POA: Diagnosis not present

## 2021-02-23 ENCOUNTER — Encounter: Payer: Self-pay | Admitting: Family Medicine

## 2021-03-22 DIAGNOSIS — M2170 Unequal limb length (acquired), unspecified site: Secondary | ICD-10-CM | POA: Diagnosis not present

## 2021-03-22 DIAGNOSIS — M25571 Pain in right ankle and joints of right foot: Secondary | ICD-10-CM | POA: Diagnosis not present

## 2021-03-22 DIAGNOSIS — M722 Plantar fascial fibromatosis: Secondary | ICD-10-CM | POA: Diagnosis not present

## 2021-04-11 DIAGNOSIS — M79671 Pain in right foot: Secondary | ICD-10-CM | POA: Diagnosis not present

## 2021-04-11 DIAGNOSIS — M659 Synovitis and tenosynovitis, unspecified: Secondary | ICD-10-CM | POA: Diagnosis not present

## 2021-04-13 DIAGNOSIS — M76821 Posterior tibial tendinitis, right leg: Secondary | ICD-10-CM | POA: Diagnosis not present

## 2021-04-13 DIAGNOSIS — M722 Plantar fascial fibromatosis: Secondary | ICD-10-CM | POA: Diagnosis not present

## 2021-04-13 DIAGNOSIS — M216X1 Other acquired deformities of right foot: Secondary | ICD-10-CM | POA: Diagnosis not present

## 2021-04-13 DIAGNOSIS — M2170 Unequal limb length (acquired), unspecified site: Secondary | ICD-10-CM | POA: Diagnosis not present

## 2021-04-14 DIAGNOSIS — Z23 Encounter for immunization: Secondary | ICD-10-CM | POA: Diagnosis not present

## 2021-04-20 DIAGNOSIS — M25551 Pain in right hip: Secondary | ICD-10-CM | POA: Diagnosis not present

## 2021-04-20 DIAGNOSIS — M76891 Other specified enthesopathies of right lower limb, excluding foot: Secondary | ICD-10-CM | POA: Diagnosis not present

## 2021-04-20 DIAGNOSIS — G8929 Other chronic pain: Secondary | ICD-10-CM | POA: Diagnosis not present

## 2021-04-25 DIAGNOSIS — M76891 Other specified enthesopathies of right lower limb, excluding foot: Secondary | ICD-10-CM | POA: Insufficient documentation

## 2021-05-15 DIAGNOSIS — M76821 Posterior tibial tendinitis, right leg: Secondary | ICD-10-CM | POA: Diagnosis not present

## 2021-05-15 DIAGNOSIS — M2170 Unequal limb length (acquired), unspecified site: Secondary | ICD-10-CM | POA: Diagnosis not present

## 2021-05-15 DIAGNOSIS — M722 Plantar fascial fibromatosis: Secondary | ICD-10-CM | POA: Diagnosis not present

## 2021-05-31 DIAGNOSIS — M9905 Segmental and somatic dysfunction of pelvic region: Secondary | ICD-10-CM | POA: Diagnosis not present

## 2021-05-31 DIAGNOSIS — M25551 Pain in right hip: Secondary | ICD-10-CM | POA: Diagnosis not present

## 2021-05-31 DIAGNOSIS — M5451 Vertebrogenic low back pain: Secondary | ICD-10-CM | POA: Diagnosis not present

## 2021-06-12 DIAGNOSIS — M25551 Pain in right hip: Secondary | ICD-10-CM | POA: Diagnosis not present

## 2021-06-12 DIAGNOSIS — M9905 Segmental and somatic dysfunction of pelvic region: Secondary | ICD-10-CM | POA: Diagnosis not present

## 2021-06-12 DIAGNOSIS — M5451 Vertebrogenic low back pain: Secondary | ICD-10-CM | POA: Diagnosis not present

## 2021-06-14 DIAGNOSIS — R102 Pelvic and perineal pain: Secondary | ICD-10-CM | POA: Diagnosis not present

## 2021-06-19 DIAGNOSIS — M722 Plantar fascial fibromatosis: Secondary | ICD-10-CM | POA: Diagnosis not present

## 2021-06-19 DIAGNOSIS — M76821 Posterior tibial tendinitis, right leg: Secondary | ICD-10-CM | POA: Diagnosis not present

## 2021-07-05 DIAGNOSIS — M25571 Pain in right ankle and joints of right foot: Secondary | ICD-10-CM | POA: Diagnosis not present

## 2021-07-05 DIAGNOSIS — M79671 Pain in right foot: Secondary | ICD-10-CM | POA: Diagnosis not present

## 2021-07-05 DIAGNOSIS — M25551 Pain in right hip: Secondary | ICD-10-CM | POA: Diagnosis not present

## 2021-07-09 DIAGNOSIS — M79671 Pain in right foot: Secondary | ICD-10-CM | POA: Diagnosis not present

## 2021-07-09 DIAGNOSIS — M25571 Pain in right ankle and joints of right foot: Secondary | ICD-10-CM | POA: Diagnosis not present

## 2021-07-09 DIAGNOSIS — M25551 Pain in right hip: Secondary | ICD-10-CM | POA: Diagnosis not present

## 2021-07-13 DIAGNOSIS — M79671 Pain in right foot: Secondary | ICD-10-CM | POA: Diagnosis not present

## 2021-07-13 DIAGNOSIS — M25551 Pain in right hip: Secondary | ICD-10-CM | POA: Diagnosis not present

## 2021-07-13 DIAGNOSIS — M25571 Pain in right ankle and joints of right foot: Secondary | ICD-10-CM | POA: Diagnosis not present

## 2021-07-19 DIAGNOSIS — M25511 Pain in right shoulder: Secondary | ICD-10-CM | POA: Diagnosis not present

## 2021-07-19 DIAGNOSIS — M25551 Pain in right hip: Secondary | ICD-10-CM | POA: Diagnosis not present

## 2021-07-19 DIAGNOSIS — M79671 Pain in right foot: Secondary | ICD-10-CM | POA: Diagnosis not present

## 2021-07-19 DIAGNOSIS — M25571 Pain in right ankle and joints of right foot: Secondary | ICD-10-CM | POA: Diagnosis not present

## 2021-07-20 DIAGNOSIS — D485 Neoplasm of uncertain behavior of skin: Secondary | ICD-10-CM | POA: Diagnosis not present

## 2021-07-20 DIAGNOSIS — L728 Other follicular cysts of the skin and subcutaneous tissue: Secondary | ICD-10-CM | POA: Diagnosis not present

## 2021-07-20 DIAGNOSIS — L2084 Intrinsic (allergic) eczema: Secondary | ICD-10-CM | POA: Diagnosis not present

## 2021-07-20 DIAGNOSIS — X32XXXA Exposure to sunlight, initial encounter: Secondary | ICD-10-CM | POA: Diagnosis not present

## 2021-07-20 DIAGNOSIS — L570 Actinic keratosis: Secondary | ICD-10-CM | POA: Diagnosis not present

## 2021-07-23 DIAGNOSIS — M25511 Pain in right shoulder: Secondary | ICD-10-CM | POA: Diagnosis not present

## 2021-07-23 DIAGNOSIS — M25571 Pain in right ankle and joints of right foot: Secondary | ICD-10-CM | POA: Diagnosis not present

## 2021-07-23 DIAGNOSIS — M25551 Pain in right hip: Secondary | ICD-10-CM | POA: Diagnosis not present

## 2021-07-23 DIAGNOSIS — M79671 Pain in right foot: Secondary | ICD-10-CM | POA: Diagnosis not present

## 2021-07-26 DIAGNOSIS — M25571 Pain in right ankle and joints of right foot: Secondary | ICD-10-CM | POA: Diagnosis not present

## 2021-07-26 DIAGNOSIS — M25551 Pain in right hip: Secondary | ICD-10-CM | POA: Diagnosis not present

## 2021-07-26 DIAGNOSIS — M25511 Pain in right shoulder: Secondary | ICD-10-CM | POA: Diagnosis not present

## 2021-07-26 DIAGNOSIS — M79671 Pain in right foot: Secondary | ICD-10-CM | POA: Diagnosis not present

## 2021-08-02 DIAGNOSIS — M79671 Pain in right foot: Secondary | ICD-10-CM | POA: Diagnosis not present

## 2021-08-02 DIAGNOSIS — M25551 Pain in right hip: Secondary | ICD-10-CM | POA: Diagnosis not present

## 2021-08-02 DIAGNOSIS — M25571 Pain in right ankle and joints of right foot: Secondary | ICD-10-CM | POA: Diagnosis not present

## 2021-08-02 DIAGNOSIS — M25511 Pain in right shoulder: Secondary | ICD-10-CM | POA: Diagnosis not present

## 2021-08-06 DIAGNOSIS — M25551 Pain in right hip: Secondary | ICD-10-CM | POA: Diagnosis not present

## 2021-08-06 DIAGNOSIS — M25571 Pain in right ankle and joints of right foot: Secondary | ICD-10-CM | POA: Diagnosis not present

## 2021-08-06 DIAGNOSIS — M79671 Pain in right foot: Secondary | ICD-10-CM | POA: Diagnosis not present

## 2021-08-06 DIAGNOSIS — M25511 Pain in right shoulder: Secondary | ICD-10-CM | POA: Diagnosis not present

## 2021-08-07 DIAGNOSIS — M5451 Vertebrogenic low back pain: Secondary | ICD-10-CM | POA: Diagnosis not present

## 2021-08-07 DIAGNOSIS — M25551 Pain in right hip: Secondary | ICD-10-CM | POA: Diagnosis not present

## 2021-08-07 DIAGNOSIS — M629 Disorder of muscle, unspecified: Secondary | ICD-10-CM | POA: Diagnosis not present

## 2021-08-10 DIAGNOSIS — M79671 Pain in right foot: Secondary | ICD-10-CM | POA: Diagnosis not present

## 2021-08-10 DIAGNOSIS — M25571 Pain in right ankle and joints of right foot: Secondary | ICD-10-CM | POA: Diagnosis not present

## 2021-08-10 DIAGNOSIS — M25511 Pain in right shoulder: Secondary | ICD-10-CM | POA: Diagnosis not present

## 2021-08-10 DIAGNOSIS — M25551 Pain in right hip: Secondary | ICD-10-CM | POA: Diagnosis not present

## 2021-08-15 DIAGNOSIS — M79671 Pain in right foot: Secondary | ICD-10-CM | POA: Diagnosis not present

## 2021-08-15 DIAGNOSIS — M25571 Pain in right ankle and joints of right foot: Secondary | ICD-10-CM | POA: Diagnosis not present

## 2021-08-15 DIAGNOSIS — M25551 Pain in right hip: Secondary | ICD-10-CM | POA: Diagnosis not present

## 2021-08-15 DIAGNOSIS — M25511 Pain in right shoulder: Secondary | ICD-10-CM | POA: Diagnosis not present

## 2021-08-20 DIAGNOSIS — M79671 Pain in right foot: Secondary | ICD-10-CM | POA: Diagnosis not present

## 2021-08-20 DIAGNOSIS — M25551 Pain in right hip: Secondary | ICD-10-CM | POA: Diagnosis not present

## 2021-08-20 DIAGNOSIS — M25511 Pain in right shoulder: Secondary | ICD-10-CM | POA: Diagnosis not present

## 2021-08-20 DIAGNOSIS — M25571 Pain in right ankle and joints of right foot: Secondary | ICD-10-CM | POA: Diagnosis not present

## 2021-08-21 DIAGNOSIS — M5451 Vertebrogenic low back pain: Secondary | ICD-10-CM | POA: Diagnosis not present

## 2021-08-21 DIAGNOSIS — M629 Disorder of muscle, unspecified: Secondary | ICD-10-CM | POA: Diagnosis not present

## 2021-08-21 DIAGNOSIS — M25551 Pain in right hip: Secondary | ICD-10-CM | POA: Diagnosis not present

## 2021-08-22 DIAGNOSIS — M25571 Pain in right ankle and joints of right foot: Secondary | ICD-10-CM | POA: Diagnosis not present

## 2021-08-22 DIAGNOSIS — M76821 Posterior tibial tendinitis, right leg: Secondary | ICD-10-CM | POA: Diagnosis not present

## 2021-08-30 DIAGNOSIS — M25551 Pain in right hip: Secondary | ICD-10-CM | POA: Diagnosis not present

## 2021-08-30 DIAGNOSIS — M25571 Pain in right ankle and joints of right foot: Secondary | ICD-10-CM | POA: Diagnosis not present

## 2021-08-30 DIAGNOSIS — M79671 Pain in right foot: Secondary | ICD-10-CM | POA: Diagnosis not present

## 2021-08-30 DIAGNOSIS — M25511 Pain in right shoulder: Secondary | ICD-10-CM | POA: Diagnosis not present

## 2021-09-06 DIAGNOSIS — M25511 Pain in right shoulder: Secondary | ICD-10-CM | POA: Diagnosis not present

## 2021-09-06 DIAGNOSIS — M25571 Pain in right ankle and joints of right foot: Secondary | ICD-10-CM | POA: Diagnosis not present

## 2021-09-06 DIAGNOSIS — M25551 Pain in right hip: Secondary | ICD-10-CM | POA: Diagnosis not present

## 2021-09-06 DIAGNOSIS — M79671 Pain in right foot: Secondary | ICD-10-CM | POA: Diagnosis not present

## 2021-09-19 DIAGNOSIS — M25511 Pain in right shoulder: Secondary | ICD-10-CM | POA: Diagnosis not present

## 2021-09-19 DIAGNOSIS — M25571 Pain in right ankle and joints of right foot: Secondary | ICD-10-CM | POA: Diagnosis not present

## 2021-09-19 DIAGNOSIS — M25551 Pain in right hip: Secondary | ICD-10-CM | POA: Diagnosis not present

## 2021-09-19 DIAGNOSIS — M79671 Pain in right foot: Secondary | ICD-10-CM | POA: Diagnosis not present

## 2021-10-03 DIAGNOSIS — M25571 Pain in right ankle and joints of right foot: Secondary | ICD-10-CM | POA: Diagnosis not present

## 2021-10-03 DIAGNOSIS — M7741 Metatarsalgia, right foot: Secondary | ICD-10-CM | POA: Diagnosis not present

## 2021-10-03 DIAGNOSIS — M722 Plantar fascial fibromatosis: Secondary | ICD-10-CM | POA: Diagnosis not present

## 2021-10-10 DIAGNOSIS — M25571 Pain in right ankle and joints of right foot: Secondary | ICD-10-CM | POA: Diagnosis not present

## 2021-10-10 DIAGNOSIS — M79671 Pain in right foot: Secondary | ICD-10-CM | POA: Diagnosis not present

## 2021-10-10 DIAGNOSIS — M25551 Pain in right hip: Secondary | ICD-10-CM | POA: Diagnosis not present

## 2021-10-10 DIAGNOSIS — M25511 Pain in right shoulder: Secondary | ICD-10-CM | POA: Diagnosis not present

## 2021-10-29 DIAGNOSIS — M25511 Pain in right shoulder: Secondary | ICD-10-CM | POA: Diagnosis not present

## 2021-10-29 DIAGNOSIS — M79671 Pain in right foot: Secondary | ICD-10-CM | POA: Diagnosis not present

## 2021-10-29 DIAGNOSIS — M25551 Pain in right hip: Secondary | ICD-10-CM | POA: Diagnosis not present

## 2021-10-29 DIAGNOSIS — M25571 Pain in right ankle and joints of right foot: Secondary | ICD-10-CM | POA: Diagnosis not present

## 2021-11-01 ENCOUNTER — Telehealth: Payer: Self-pay | Admitting: Family Medicine

## 2021-11-01 DIAGNOSIS — M722 Plantar fascial fibromatosis: Secondary | ICD-10-CM | POA: Diagnosis not present

## 2021-11-01 DIAGNOSIS — M25571 Pain in right ankle and joints of right foot: Secondary | ICD-10-CM | POA: Diagnosis not present

## 2021-11-01 NOTE — Telephone Encounter (Signed)
Copied from [place]. Topic: Appointment Scheduling - Scheduling Inquiry for Clinic >> Nov 01, 2021  3:56 PM Mallory Weber wrote: Reason for CRM: Pt called in and schedule appt for 06/01 for a CPE, and wanted to get some labs ordered for that appt, please advise.

## 2021-11-08 ENCOUNTER — Ambulatory Visit
Admission: RE | Admit: 2021-11-08 | Discharge: 2021-11-08 | Disposition: A | Discharge status: Home / Self Care | Payer: BC Managed Care – PPO | Source: Ambulatory Visit | Attending: Family Medicine | Admitting: Family Medicine

## 2021-11-08 ENCOUNTER — Ambulatory Visit
Admission: RE | Admit: 2021-11-08 | Discharge: 2021-11-08 | Disposition: A | Discharge status: Home / Self Care | Payer: BC Managed Care – PPO | Attending: Family Medicine | Admitting: Family Medicine

## 2021-11-08 ENCOUNTER — Encounter: Payer: Self-pay | Admitting: Family Medicine

## 2021-11-08 ENCOUNTER — Telehealth: Payer: Self-pay

## 2021-11-08 ENCOUNTER — Ambulatory Visit: Payer: BC Managed Care – PPO | Admitting: Family Medicine

## 2021-11-08 ENCOUNTER — Other Ambulatory Visit: Payer: Self-pay

## 2021-11-08 VITALS — BP 120/70 | HR 76 | Ht 65.0 in | Wt 158.0 lb

## 2021-11-08 DIAGNOSIS — Z1211 Encounter for screening for malignant neoplasm of colon: Secondary | ICD-10-CM

## 2021-11-08 DIAGNOSIS — M542 Cervicalgia: Secondary | ICD-10-CM

## 2021-11-08 DIAGNOSIS — T753XXD Motion sickness, subsequent encounter: Secondary | ICD-10-CM

## 2021-11-08 DIAGNOSIS — E785 Hyperlipidemia, unspecified: Secondary | ICD-10-CM

## 2021-11-08 DIAGNOSIS — R5383 Other fatigue: Secondary | ICD-10-CM

## 2021-11-08 MED ORDER — MELOXICAM 7.5 MG PO TABS: 7.5000 mg | ORAL_TABLET | ORAL | 1 refills | Status: DC | PRN

## 2021-11-08 MED ORDER — NA SULFATE-K SULFATE-MG SULF 17.5-3.13-1.6 GM/177ML PO SOLN: 1.0000 | Freq: Once | ORAL | 0 refills | Status: AC

## 2021-11-08 MED ORDER — SCOPOLAMINE 1 MG/3DAYS TD PT72: 1.0000 | MEDICATED_PATCH | TRANSDERMAL | 0 refills | Status: DC

## 2021-11-08 NOTE — Progress Notes (Signed)
Date:  11/08/2021   Name:  Mallory Weber   DOB:  [birth year]   MRN:  [id number]   Chief Complaint: motion sickness, Colon Cancer Screening, and Neck Pain (Taking meloxicam "as needed")  Neck Pain  This is a new problem. The current episode started 1 to 4 weeks ago (3 weeks ago). The problem has been waxing and waning. The pain is associated with nothing. The pain is present in the left side (new). The quality of the pain is described as aching. The pain is at a severity of 2/10. The pain is moderate. Nothing aggravates the symptoms. Associated symptoms include numbness and tingling. Pertinent negatives include no chest pain or weakness. She has tried NSAIDs for the symptoms.   Lab Results  Component Value Date   NA 143 10/29/2019   K 4.7 10/29/2019   CO2 25 10/29/2019   GLUCOSE 115 (H) 10/29/2019   BUN 13 10/29/2019   CREATININE 0.78 10/29/2019   CALCIUM 9.9 10/29/2019   GFRNONAA 85 10/29/2019   Lab Results  Component Value Date   CHOL 195 03/06/2017   HDL 49 03/06/2017   LDLCALC 117 (H) 03/06/2017   TRIG 143 03/06/2017   CHOLHDL 4.0 03/06/2017   Lab Results  Component Value Date   TSH 1.350 10/29/2019   No results found for: HGBA1C Lab Results  Component Value Date   WBC 4.3 10/29/2019   HGB 13.7 10/29/2019   HCT 42.2 10/29/2019   MCV 93 10/29/2019   PLT 206 10/29/2019   Lab Results  Component Value Date   ALT 12 10/29/2019   AST 16 10/29/2019   ALKPHOS 60 10/29/2019   BILITOT 0.3 10/29/2019   No results found for: 25OHVITD2, 25OHVITD3, VD25OH   Review of Systems  Constitutional:  Negative for chills, fatigue and unexpected weight change.  Respiratory: Negative.    Cardiovascular: Negative.  Negative for chest pain, palpitations and leg swelling.  Gastrointestinal:  Negative for blood in stool, constipation, diarrhea and nausea.  Musculoskeletal:  Positive for neck pain. Negative for back pain.  Neurological:  Positive for tingling and numbness.  Negative for weakness.   Patient Active Problem List   Diagnosis Date Noted   H/O abdominal hysterectomy 09/20/2020    Allergies  Allergen Reactions   Iodinated Contrast Media Hives   Cat Hair Extract Itching and Other (See Comments)    Also sneezing & eye irritation    Past Surgical History:  Procedure Laterality Date   BREAST CYST EXCISION Bilateral     Social History   Tobacco Use   Smoking status: Never   Smokeless tobacco: Never  Substance Use Topics   Alcohol use: Yes    Alcohol/week: 0.0 standard drinks   Drug use: No     Medication list has been reviewed and updated.  Current Meds  Medication Sig   acetaminophen (TYLENOL) 500 MG tablet Take 500 mg by mouth every 6 (six) hours as needed.   Calcium Carb-Cholecalciferol (CALCIUM 1000 + D PO) Take 1 capsule by mouth daily.   meloxicam (MOBIC) 7.5 MG tablet Take 7.5 mg by mouth as needed for pain.   Minoxidil (ROGAINE WOMENS EX) Apply topically. otc   Multiple Vitamins-Minerals (CENTRUM SILVER ULTRA WOMENS PO) Take 2 each by mouth daily.   Multiple Vitamins-Minerals (PRESERVISION AREDS 2) CAPS Take 1 capsule by mouth daily.   [DISCONTINUED] Multiple Vitamin (MULTI-VITAMINS) TABS Take 1 tablet by mouth daily at 6 (six) AM.       11/08/2021  10:13 AM 09/19/2020   11:38 AM 10/29/2019    9:09 AM  GAD 7 : Generalized Anxiety Score  Nervous, Anxious, on Edge 1 0 0  Control/stop worrying 1 1 0  Worry too much - different things 1 1 0  Trouble relaxing 0 0 0  Restless 0 0 0  Easily annoyed or irritable 1 0 0  Afraid - awful might happen 0 0 0  Total GAD 7 Score 4 2 0  Anxiety Difficulty Not difficult at all Not difficult at all        11/08/2021   10:13 AM  Depression screen PHQ 2/9  Decreased Interest 0  Down, Depressed, Hopeless 0  PHQ - 2 Score 0  Altered sleeping 2  Tired, decreased energy 1  Change in appetite 0  Feeling bad or failure about yourself  0  Trouble concentrating 0  Moving slowly or  fidgety/restless 0  Suicidal thoughts 0  PHQ-9 Score 3  Difficult doing work/chores Somewhat difficult    BP Readings from Last 3 Encounters:  11/08/21 120/70  09/19/20 100/70  10/29/19 120/64    Physical Exam Vitals and nursing note reviewed. Exam conducted with a chaperone present.  Constitutional:      General: She is not in acute distress.    Appearance: She is not diaphoretic.  HENT:     Head: Normocephalic and atraumatic.     Right Ear: External ear normal.     Left Ear: External ear normal.     Nose: Nose normal.  Eyes:     General:        Right eye: No discharge.        Left eye: No discharge.     Conjunctiva/sclera: Conjunctivae normal.     Pupils: Pupils are equal, round, and reactive to light.  Neck:     Thyroid: No thyromegaly.     Vascular: Normal carotid pulses. No carotid bruit, hepatojugular reflux or JVD.     Trachea: Trachea normal.     Comments: Tenderness insertion left sternoceidomastoid  Cardiovascular:     Rate and Rhythm: Normal rate and regular rhythm.     Heart sounds: Normal heart sounds, S1 normal and S2 normal. No murmur heard. No systolic murmur is present.  No diastolic murmur is present.    No friction rub. No gallop. No S3 or S4 sounds.  Pulmonary:     Effort: Pulmonary effort is normal.     Breath sounds: Normal breath sounds. No decreased breath sounds, wheezing, rhonchi or rales.  Abdominal:     General: Bowel sounds are normal.     Palpations: Abdomen is soft. There is no mass.     Tenderness: There is no abdominal tenderness. There is no guarding.  Musculoskeletal:        General: Normal range of motion.     Cervical back: Full passive range of motion without pain, normal range of motion and neck supple. No signs of trauma or rigidity. Muscular tenderness present. No pain with movement or spinous process tenderness. Normal range of motion.     Right lower leg: No edema.     Left lower leg: No edema.  Lymphadenopathy:      Cervical: No cervical adenopathy.     Right cervical: No superficial, deep or posterior cervical adenopathy.    Left cervical: No superficial, deep or posterior cervical adenopathy.  Skin:    General: Skin is warm and dry.  Neurological:     Mental Status: She  is alert.     Sensory: Sensation is intact.     Motor: Motor function is intact.     Deep Tendon Reflexes: Reflexes are normal and symmetric.    Wt Readings from Last 3 Encounters:  11/08/21 158 lb (71.7 kg)  09/19/20 158 lb (71.7 kg)  10/29/19 152 lb (68.9 kg)    BP 120/70   Pulse 76   Ht '5\' 5"'$  (1.651 m)   Wt 158 lb (71.7 kg)   BMI 26.29 kg/m   Assessment and Plan:  1. Cervical pain (neck) Relatively new onset but persistent.  Exam notes spasm of the trapezius and tenderness on the insertion of the sternocleidomastoid.  I am concerned that this is a manifestation of cervical degenerative cervical disease and have encouraged to continue meloxicam 7.5 mg once a day and we will obtain a cervical spine x-ray. - DG Cervical Spine Complete; Future - meloxicam (MOBIC) 7.5 MG tablet; Take 1 tablet (7.5 mg total) by mouth as needed for pain.  Dispense: 30 tablet; Refill: 1  2. Fatigue, unspecified type Patient has been having some fatigue and we will obtain a renal function panel CBC and hepatic panel. - Renal Function Panel - CBC with Differential/Platelet - Hepatic Function Panel (6)  3. Colon cancer screening Discussed and appointment made for gastroenterology. - Ambulatory referral to Gastroenterology  4. Hyperlipidemia, unspecified hyperlipidemia type Review of previous lipids notes mild elevation of LDL and we will repeat lipid panel with likelihood of low-cholesterol diet. - Lipid Panel With LDL/HDL Ratio  5. Motion sickness, subsequent encounter Patient with upcoming cruise and would like scopolamine patches which we will provide however I have cautioned her that this could make her sedated and she may want to  hold to see how she does initially on the cruise without having to use the patches. - scopolamine (TRANSDERM-SCOP) 1 MG/3DAYS; Place 1 patch (1.5 mg total) onto the skin every 3 (three) days.  Dispense: 10 patch; Refill: 0

## 2021-11-08 NOTE — Telephone Encounter (Signed)
Gastroenterology Pre-Procedure Review  Request Date: 12/06/21 Requesting Physician: Dr. Allen Norris  PATIENT REVIEW QUESTIONS: The patient responded to the following health history questions as indicated:    1. Are you having any GI issues? no 2. Do you have a personal history of Polyps?no 3. Do you have a family history of Colon Cancer or Polyps? yes (father colo polyps) 4. Diabetes Mellitus? no 5. Joint replacements in the past 12 months?no 6. Major health problems in the past 3 months?no 7. Any artificial heart valves, MVP, or defibrillator?no    MEDICATIONS & ALLERGIES:    Patient reports the following regarding taking any anticoagulation/antiplatelet therapy:   Plavix, Coumadin, Eliquis, Xarelto, Lovenox, Pradaxa, Brilinta, or Effient? no Aspirin? no  Patient confirms/reports the following medications:  Current Outpatient Medications  Medication Sig Dispense Refill   acetaminophen (TYLENOL) 500 MG tablet Take 500 mg by mouth every 6 (six) hours as needed.     Calcium Carb-Cholecalciferol (CALCIUM 1000 + D PO) Take 1 capsule by mouth daily.     meloxicam (MOBIC) 7.5 MG tablet Take 1 tablet (7.5 mg total) by mouth as needed for pain. 30 tablet 1   Minoxidil (ROGAINE WOMENS EX) Apply topically. otc     Multiple Vitamins-Minerals (CENTRUM SILVER ULTRA WOMENS PO) Take 2 each by mouth daily.     Multiple Vitamins-Minerals (PRESERVISION AREDS 2) CAPS Take 1 capsule by mouth daily.     scopolamine (TRANSDERM-SCOP) 1 MG/3DAYS Place 1 patch (1.5 mg total) onto the skin every 3 (three) days. 10 patch 0   No current facility-administered medications for this visit.    Patient confirms/reports the following allergies:  Allergies  Allergen Reactions   Iodinated Contrast Media Hives   Cat Hair Extract Itching and Other (See Comments)    Also sneezing & eye irritation    No orders of the defined types were placed in this encounter.   AUTHORIZATION INFORMATION Primary  Insurance: 1D#: Group #:  Secondary Insurance: 1D#: Group #:  SCHEDULE INFORMATION: Date: 12/06/21 Time: Location: [place]

## 2021-11-09 LAB — CBC WITH DIFFERENTIAL/PLATELET
Basophils Absolute: 0 10*3/uL (ref 0.0–0.2)
Basos: 1 %
EOS (ABSOLUTE): 0.1 10*3/uL (ref 0.0–0.4)
Eos: 2 %
Hematocrit: 39.8 % (ref 34.0–46.6)
Hemoglobin: 13.6 g/dL (ref 11.1–15.9)
Immature Grans (Abs): 0 10*3/uL (ref 0.0–0.1)
Immature Granulocytes: 0 %
Lymphocytes Absolute: 1.2 10*3/uL (ref 0.7–3.1)
Lymphs: 29 %
MCH: 31.6 pg (ref 26.6–33.0)
MCHC: 34.2 g/dL (ref 31.5–35.7)
MCV: 92 fL (ref 79–97)
Monocytes Absolute: 0.3 10*3/uL (ref 0.1–0.9)
Monocytes: 8 %
Neutrophils Absolute: 2.6 10*3/uL (ref 1.4–7.0)
Neutrophils: 60 %
Platelets: 200 10*3/uL (ref 150–450)
RBC: 4.31 x10E6/uL (ref 3.77–5.28)
RDW: 12.6 % (ref 11.7–15.4)
WBC: 4.3 10*3/uL (ref 3.4–10.8)

## 2021-11-09 LAB — HEPATIC FUNCTION PANEL (6)
ALT: 11 IU/L (ref 0–32)
AST: 16 IU/L (ref 0–40)
Alkaline Phosphatase: 56 IU/L (ref 44–121)
Bilirubin Total: 0.4 mg/dL (ref 0.0–1.2)
Bilirubin, Direct: 0.1 mg/dL (ref 0.00–0.40)

## 2021-11-09 LAB — RENAL FUNCTION PANEL
Albumin: 4.8 g/dL (ref 3.8–4.9)
BUN/Creatinine Ratio: 22 (ref 9–23)
BUN: 17 mg/dL (ref 6–24)
CO2: 25 mmol/L (ref 20–29)
Calcium: 9.3 mg/dL (ref 8.7–10.2)
Chloride: 103 mmol/L (ref 96–106)
Creatinine, Ser: 0.79 mg/dL (ref 0.57–1.00)
Glucose: 114 mg/dL — ABNORMAL HIGH (ref 70–99)
Phosphorus: 3.6 mg/dL (ref 3.0–4.3)
Potassium: 4.6 mmol/L (ref 3.5–5.2)
Sodium: 141 mmol/L (ref 134–144)
eGFR: 87 mL/min/{1.73_m2} (ref >59)

## 2021-11-09 LAB — LIPID PANEL WITH LDL/HDL RATIO
Cholesterol, Total: 225 mg/dL — ABNORMAL HIGH (ref 100–199)
HDL: 51 mg/dL (ref >39)
LDL Chol Calc (NIH): 151 mg/dL — ABNORMAL HIGH (ref 0–99)
LDL/HDL Ratio: 3 ratio (ref 0.0–3.2)
Triglycerides: 127 mg/dL (ref 0–149)
VLDL Cholesterol Cal: 23 mg/dL (ref 5–40)

## 2021-11-27 ENCOUNTER — Encounter: Payer: Self-pay | Admitting: Gastroenterology

## 2021-11-27 DIAGNOSIS — M5451 Vertebrogenic low back pain: Secondary | ICD-10-CM | POA: Diagnosis not present

## 2021-11-27 DIAGNOSIS — M629 Disorder of muscle, unspecified: Secondary | ICD-10-CM | POA: Diagnosis not present

## 2021-11-27 DIAGNOSIS — M25551 Pain in right hip: Secondary | ICD-10-CM | POA: Diagnosis not present

## 2021-12-06 ENCOUNTER — Ambulatory Visit: Payer: BC Managed Care – PPO | Admitting: Anesthesiology

## 2021-12-06 ENCOUNTER — Encounter: Payer: Self-pay | Admitting: Gastroenterology

## 2021-12-06 ENCOUNTER — Ambulatory Visit
Admission: RE | Admit: 2021-12-06 | Discharge: 2021-12-06 | Disposition: A | Discharge status: Home / Self Care | Payer: BC Managed Care – PPO | Attending: Gastroenterology | Admitting: Gastroenterology

## 2021-12-06 ENCOUNTER — Encounter
Admission: RE | Disposition: A | Discharge status: Home / Self Care | Payer: Self-pay | Source: Home / Self Care | Attending: Gastroenterology

## 2021-12-06 ENCOUNTER — Other Ambulatory Visit: Payer: Self-pay

## 2021-12-06 DIAGNOSIS — Z1211 Encounter for screening for malignant neoplasm of colon: Secondary | ICD-10-CM | POA: Insufficient documentation

## 2021-12-06 DIAGNOSIS — K648 Other hemorrhoids: Secondary | ICD-10-CM | POA: Insufficient documentation

## 2021-12-06 DIAGNOSIS — K5730 Diverticulosis of large intestine without perforation or abscess without bleeding: Secondary | ICD-10-CM | POA: Diagnosis not present

## 2021-12-06 HISTORY — DX: Other specified health status: Z78.9

## 2021-12-06 HISTORY — PX: COLONOSCOPY WITH PROPOFOL: SHX5780

## 2021-12-06 SURGERY — COLONOSCOPY WITH PROPOFOL
Anesthesia: General | Site: Rectum

## 2021-12-06 MED ORDER — LIDOCAINE HCL (CARDIAC) PF 100 MG/5ML IV SOSY
PREFILLED_SYRINGE | INTRAVENOUS | Status: DC | PRN
  Administered 2021-12-06: 30 mg via INTRAVENOUS

## 2021-12-06 MED ORDER — SODIUM CHLORIDE 0.9 % IV SOLN: INTRAVENOUS | Status: DC

## 2021-12-06 MED ORDER — PROPOFOL 10 MG/ML IV BOLUS
INTRAVENOUS | Status: DC | PRN
  Administered 2021-12-06 (×2): 30 mg via INTRAVENOUS
  Administered 2021-12-06: 150 mg via INTRAVENOUS
  Administered 2021-12-06: 30 mg via INTRAVENOUS
  Administered 2021-12-06 (×2): 40 mg via INTRAVENOUS

## 2021-12-06 MED ORDER — STERILE WATER FOR IRRIGATION IR SOLN
Status: DC | PRN
  Administered 2021-12-06: 100 mL

## 2021-12-06 MED ORDER — LACTATED RINGERS IV SOLN: INTRAVENOUS | Status: DC

## 2021-12-06 SURGICAL SUPPLY — 6 items
GOWN CVR UNV OPN BCK APRN NK (MISCELLANEOUS) ×2 IMPLANT
GOWN ISOL THUMB LOOP REG UNIV (MISCELLANEOUS) ×4
KIT PRC NS LF DISP ENDO (KITS) ×1 IMPLANT
KIT PROCEDURE OLYMPUS (KITS) ×2
MANIFOLD NEPTUNE II (INSTRUMENTS) ×2 IMPLANT
WATER STERILE IRR 250ML POUR (IV SOLUTION) ×2 IMPLANT

## 2021-12-06 NOTE — Anesthesia Preprocedure Evaluation (Signed)
Anesthesia Evaluation  Patient identified by MRN, date of birth, ID band Patient awake    History of Anesthesia Complications Negative for: history of anesthetic complications  Airway Mallampati: II  TM Distance: >3 FB Neck ROM: Full    Dental no notable dental hx.    Pulmonary neg pulmonary ROS,    Pulmonary exam normal        Cardiovascular Exercise Tolerance: Good negative cardio ROS Normal cardiovascular exam     Neuro/Psych negative neurological ROS     GI/Hepatic negative GI ROS, Neg liver ROS,   Endo/Other  negative endocrine ROS  Renal/GU negative Renal ROS     Musculoskeletal   Abdominal   Peds  Hematology negative hematology ROS (+)   Anesthesia Other Findings   Reproductive/Obstetrics                             Anesthesia Physical Anesthesia Plan  ASA: 1  Anesthesia Plan: General   Post-op Pain Management: Minimal or no pain anticipated   Induction: Intravenous  PONV Risk Score and Plan: 3 and Propofol infusion, TIVA and Treatment may vary due to age or medical condition  Airway Management Planned: Nasal Cannula and Natural Airway  Additional Equipment: None  Intra-op Plan:   Post-operative Plan:   Informed Consent: I have reviewed the patients History and Physical, chart, labs and discussed the procedure including the risks, benefits and alternatives for the proposed anesthesia with the patient or authorized representative who has indicated his/her understanding and acceptance.       Plan Discussed with: CRNA  Anesthesia Plan Comments:         Anesthesia Quick Evaluation

## 2021-12-06 NOTE — Anesthesia Procedure Notes (Signed)
Date/Time: 12/06/2021 10:14 AM  Performed by: Cameron Ali, CRNAPre-anesthesia Checklist: Patient identified, Emergency Drugs available, Suction available, Timeout performed and Patient being monitored Patient Re-evaluated:Patient Re-evaluated prior to induction Oxygen Delivery Method: Nasal cannula Placement Confirmation: positive ETCO2

## 2021-12-06 NOTE — Transfer of Care (Signed)
Immediate Anesthesia Transfer of Care Note  Patient: Mallory Weber  Procedure(s) Performed: COLONOSCOPY WITH PROPOFOL (Rectum)  Patient Location: PACU  Anesthesia Type: General  Level of Consciousness: awake, alert  and patient cooperative  Airway and Oxygen Therapy: Patient Spontanous Breathing and Patient connected to supplemental oxygen  Post-op Assessment: Post-op Vital signs reviewed, Patient's Cardiovascular Status Stable, Respiratory Function Stable, Patent Airway and No signs of Nausea or vomiting  Post-op Vital Signs: Reviewed and stable  Complications: No notable events documented.

## 2021-12-06 NOTE — Op Note (Signed)
[facility] Gastroenterology Patient Name: Mallory Weber Procedure Date: 12/06/2021 9:49 AM MRN: [id number] Account #: [id number] Date of Birth: Jul 31, 1965 Admit Type: Outpatient Age: 56 Room: [facility] OR ROOM 01 Gender: Female Note Status: Finalized Instrument Name: [id number] Procedure:             Colonoscopy Indications:           Screening for colorectal malignant neoplasm Providers:             Lucilla Lame MD, MD Referring MD:          Juline Patch, MD (Referring MD) Medicines:             Propofol per Anesthesia Complications:         No immediate complications. Procedure:             Pre-Anesthesia Assessment:                        - Prior to the procedure, a History and Physical was                         performed, and patient medications and allergies were                         reviewed. The patient's tolerance of previous                         anesthesia was also reviewed. The risks and benefits                         of the procedure and the sedation options and risks                         were discussed with the patient. All questions were                         answered, and informed consent was obtained. Prior                         Anticoagulants: The patient has taken no previous                         anticoagulant or antiplatelet agents. ASA Grade                         Assessment: II - A patient with mild systemic disease.                         After reviewing the risks and benefits, the patient                         was deemed in satisfactory condition to undergo the                         procedure.                        After obtaining informed consent, the colonoscope was  passed under direct vision. Throughout the procedure,                         the patient's blood pressure, pulse, and oxygen                         saturations were monitored continuously. The                          Colonoscope was introduced through the anus and                         advanced to the the cecum, identified by appendiceal                         orifice and ileocecal valve. The colonoscopy was                         performed without difficulty. The patient tolerated                         the procedure well. The quality of the bowel                         preparation was excellent. Findings:      The perianal and digital rectal examinations were normal.      A few small-mouthed diverticula were found in the sigmoid colon.      Non-bleeding internal hemorrhoids were found during retroflexion. The       hemorrhoids were Grade I (internal hemorrhoids that do not prolapse). Impression:            - The entire examined colon is normal.                        - No specimens collected. Recommendation:        - Discharge patient to home.                        - Resume previous diet.                        - Continue present medications.                        - Repeat colonoscopy in 10 years for screening                         purposes. Procedure Code(s):     --- Professional ---                        [phone], Colonoscopy, flexible; diagnostic, including                         collection of specimen(s) by brushing or washing, when                         performed (separate procedure) Diagnosis Code(s):     --- Professional ---  Z12.11, Encounter for screening for malignant neoplasm                         of colon CPT copyright 2019 American Medical Association. All rights reserved. The codes documented in this report are preliminary and upon coder review may  be revised to meet current compliance requirements. Lucilla Lame MD, MD 12/06/2021 10:18:09 AM This report has been signed electronically. Number of Addenda: 0 Note Initiated On: 12/06/2021 9:49 AM Scope Withdrawal Time: 0 hours 6 minutes 21 seconds  Total Procedure Duration: 0 hours 13 minutes 43  seconds  Estimated Blood Loss:  Estimated blood loss: none. Estimated blood loss: none.      [facility]

## 2021-12-06 NOTE — Anesthesia Postprocedure Evaluation (Signed)
Anesthesia Post Note  Patient: Mallory Weber  Procedure(s) Performed: COLONOSCOPY WITH PROPOFOL (Rectum)     Patient location during evaluation: PACU Anesthesia Type: General Level of consciousness: awake and alert Pain management: pain level controlled Vital Signs Assessment: post-procedure vital signs reviewed and stable Respiratory status: spontaneous breathing, nonlabored ventilation, respiratory function stable and patient connected to nasal cannula oxygen Cardiovascular status: blood pressure returned to baseline and stable Postop Assessment: no apparent nausea or vomiting Anesthetic complications: no   There were no known notable events for this encounter.  Adele Barthel Shyan Scalisi

## 2021-12-06 NOTE — H&P (Signed)
Mallory Lame, MD [address] [place],  [address] Phone: [phone] Fax : [phone]  Primary Care Physician:  Mallory Patch, MD Primary Gastroenterologist:  Dr. Allen Norris  Pre-Procedure History & Physical: HPI:  Mallory Weber is a 56 y.o. female is here for a screening colonoscopy.   Past Medical History:  Diagnosis Date   Medical history non-contributory     Past Surgical History:  Procedure Laterality Date   BREAST CYST EXCISION Bilateral     Prior to Admission medications   Medication Sig Start Date End Date Taking? Authorizing Provider  acetaminophen (TYLENOL) 500 MG tablet Take 500 mg by mouth every 6 (six) hours as needed.   Yes [provider]  Calcium Carb-Cholecalciferol (CALCIUM 1000 + D PO) Take 1 capsule by mouth daily.   Yes [provider]  meloxicam (MOBIC) 7.5 MG tablet Take 1 tablet (7.5 mg total) by mouth as needed for pain. 11/08/21  Yes Mallory Patch, MD  Minoxidil (ROGAINE WOMENS EX) Apply topically. otc   Yes [provider]  Multiple Vitamins-Minerals (CENTRUM SILVER ULTRA WOMENS PO) Take 2 each by mouth daily.   Yes [provider]  Multiple Vitamins-Minerals (PRESERVISION AREDS 2) CAPS Take 1 capsule by mouth daily.   Yes [provider]  scopolamine (TRANSDERM-SCOP) 1 MG/3DAYS Place 1 Weber (1.5 mg total) onto the skin every 3 (three) days. 11/08/21  Yes Mallory Patch, MD    Allergies as of 11/08/2021 - Review Complete 11/08/2021  Allergen Reaction Noted   Iodinated contrast media Hives 02/24/2015   Cat hair extract Itching and Other (See Comments) 02/24/2015    History reviewed. No pertinent family history.  Social History   Socioeconomic History   Marital status: Married    Spouse name: Not on file   Number of children: Not on file   Years of education: Not on file   Highest education level: Not on file  Occupational History   Not on file  Tobacco Use   Smoking  status: Never   Smokeless tobacco: Never  Vaping Use   Vaping Use: Never used  Substance and Sexual Activity   Alcohol use: Yes    Alcohol/week: 8.0 standard drinks of alcohol    Types: 8 Glasses of wine per week   Drug use: No   Sexual activity: Not on file  Other Topics Concern   Not on file  Social History Narrative   Not on file   Social Determinants of Health   Financial Resource Strain: Not on file  Food Insecurity: Not on file  Transportation Needs: Not on file  Physical Activity: Not on file  Stress: Not on file  Social Connections: Not on file  Intimate Partner Violence: Not on file    Review of Systems: See HPI, otherwise negative ROS  Physical Exam: BP 99/66   Pulse (!) 56   Temp (!) 97.4 F (36.3 C)   Ht '5\' 5"'$  (1.651 m)   Wt 71.7 kg   SpO2 100%   BMI 26.29 kg/m  General:   Alert,  pleasant and cooperative in NAD Head:  Normocephalic and atraumatic. Neck:  Supple; no masses or thyromegaly. Lungs:  Clear throughout to auscultation.    Heart:  Regular rate and rhythm. Abdomen:  Soft, nontender and nondistended. Normal bowel sounds, without guarding, and without rebound.   Neurologic:  Alert and  oriented x4;  grossly normal neurologically.  Impression/Plan: Mallory Weber is now here to undergo a  screening colonoscopy.  Risks, benefits, and alternatives regarding colonoscopy have been reviewed with the patient.  Questions have been answered.  All parties agreeable.

## 2021-12-10 ENCOUNTER — Encounter: Payer: Self-pay | Admitting: Gastroenterology

## 2022-01-29 DIAGNOSIS — X32XXXA Exposure to sunlight, initial encounter: Secondary | ICD-10-CM | POA: Diagnosis not present

## 2022-01-29 DIAGNOSIS — L814 Other melanin hyperpigmentation: Secondary | ICD-10-CM | POA: Diagnosis not present

## 2022-01-29 DIAGNOSIS — L570 Actinic keratosis: Secondary | ICD-10-CM | POA: Diagnosis not present

## 2022-01-29 DIAGNOSIS — D485 Neoplasm of uncertain behavior of skin: Secondary | ICD-10-CM | POA: Diagnosis not present

## 2022-02-19 DIAGNOSIS — Z803 Family history of malignant neoplasm of breast: Secondary | ICD-10-CM | POA: Diagnosis not present

## 2022-02-19 DIAGNOSIS — Z6827 Body mass index (BMI) 27.0-27.9, adult: Secondary | ICD-10-CM | POA: Diagnosis not present

## 2022-02-19 DIAGNOSIS — Z1231 Encounter for screening mammogram for malignant neoplasm of breast: Secondary | ICD-10-CM | POA: Diagnosis not present

## 2022-03-02 DIAGNOSIS — Z23 Encounter for immunization: Secondary | ICD-10-CM | POA: Diagnosis not present

## 2022-03-05 DIAGNOSIS — H04123 Dry eye syndrome of bilateral lacrimal glands: Secondary | ICD-10-CM | POA: Diagnosis not present

## 2022-07-08 ENCOUNTER — Encounter: Payer: Self-pay | Admitting: Family Medicine

## 2022-07-08 ENCOUNTER — Ambulatory Visit: Payer: BC Managed Care – PPO | Admitting: Family Medicine

## 2022-07-08 VITALS — BP 120/66 | HR 66 | Ht 65.0 in | Wt 168.0 lb

## 2022-07-08 DIAGNOSIS — M722 Plantar fascial fibromatosis: Secondary | ICD-10-CM | POA: Diagnosis not present

## 2022-07-08 DIAGNOSIS — L609 Nail disorder, unspecified: Secondary | ICD-10-CM

## 2022-07-08 DIAGNOSIS — L659 Nonscarring hair loss, unspecified: Secondary | ICD-10-CM

## 2022-07-08 NOTE — Progress Notes (Signed)
Date:  07/08/2022   Name:  Mallory Weber   DOB:  Oct 15, 1965   MRN:  [id number]   Chief Complaint: hair thinning and Nail Problem  Thyroid Problem Presents for follow-up visit. Symptoms include hair loss and nail problem. Patient reports no anxiety, cold intolerance, constipation, depressed mood, diaphoresis, diarrhea, dry skin, fatigue, heat intolerance, hoarse voice, leg swelling, menstrual problem, palpitations, tremors, visual change, weight gain or weight loss. The symptoms have been stable.    Lab Results  Component Value Date   NA 141 11/08/2021   K 4.6 11/08/2021   CO2 25 11/08/2021   GLUCOSE 114 (H) 11/08/2021   BUN 17 11/08/2021   CREATININE 0.79 11/08/2021   CALCIUM 9.3 11/08/2021   EGFR 87 11/08/2021   GFRNONAA 85 10/29/2019   Lab Results  Component Value Date   CHOL 225 (H) 11/08/2021   HDL 51 11/08/2021   LDLCALC 151 (H) 11/08/2021   TRIG 127 11/08/2021   CHOLHDL 4.0 03/06/2017   Lab Results  Component Value Date   TSH 1.350 10/29/2019   No results found for: "HGBA1C" Lab Results  Component Value Date   WBC 4.3 11/08/2021   HGB 13.6 11/08/2021   HCT 39.8 11/08/2021   MCV 92 11/08/2021   PLT 200 11/08/2021   Lab Results  Component Value Date   ALT 11 11/08/2021   AST 16 11/08/2021   ALKPHOS 56 11/08/2021   BILITOT 0.4 11/08/2021   No results found for: "25OHVITD2", "25OHVITD3", "VD25OH"   Review of Systems  Constitutional:  Negative for diaphoresis, fatigue, weight gain and weight loss.  HENT:  Negative for hoarse voice and nosebleeds.   Cardiovascular:  Negative for palpitations.  Gastrointestinal:  Negative for constipation and diarrhea.  Endocrine: Negative for cold intolerance and heat intolerance.  Genitourinary:  Negative for menstrual problem.  Skin:  Negative for color change, pallor, rash and wound.  Neurological:  Negative for tremors.  Psychiatric/Behavioral:  The patient is not nervous/anxious.     Patient Active Problem  List   Diagnosis Date Noted   Encounter for screening colonoscopy    H/O abdominal hysterectomy 09/20/2020    Allergies  Allergen Reactions   Iodinated Contrast Media Hives   Cat Hair Extract Itching and Other (See Comments)    Also sneezing & eye irritation    Past Surgical History:  Procedure Laterality Date   BREAST CYST EXCISION Bilateral    COLONOSCOPY WITH PROPOFOL N/A 12/06/2021   Procedure: COLONOSCOPY WITH PROPOFOL;  Surgeon: Lucilla Lame, MD;  Location: [facility];  Service: Endoscopy;  Laterality: N/A;    Social History   Tobacco Use   Smoking status: Never   Smokeless tobacco: Never  Vaping Use   Vaping Use: Never used  Substance Use Topics   Alcohol use: Yes    Alcohol/week: 8.0 standard drinks of alcohol    Types: 8 Glasses of wine per week   Drug use: No     Medication list has been reviewed and updated.  Current Meds  Medication Sig   acetaminophen (TYLENOL) 500 MG tablet Take 500 mg by mouth every 6 (six) hours as needed.   Calcium Carb-Cholecalciferol (CALCIUM 1000 + D PO) Take 1 capsule by mouth daily.   Multiple Vitamins-Minerals (PRESERVISION AREDS 2) CAPS Take 1 capsule by mouth daily.   [DISCONTINUED] meloxicam (MOBIC) 7.5 MG tablet Take 1 tablet (7.5 mg total) by mouth as needed for pain.   [DISCONTINUED] Minoxidil (ROGAINE WOMENS EX) Apply topically. otc   [  DISCONTINUED] Multiple Vitamins-Minerals (CENTRUM SILVER ULTRA WOMENS PO) Take 2 each by mouth daily.       07/08/2022    1:33 PM 11/08/2021   10:13 AM 09/19/2020   11:38 AM 10/29/2019    9:09 AM  GAD 7 : Generalized Anxiety Score  Nervous, Anxious, on Edge 0 1 0 0  Control/stop worrying 0 1 1 0  Worry too much - different things 0 1 1 0  Trouble relaxing 0 0 0 0  Restless 0 0 0 0  Easily annoyed or irritable 0 1 0 0  Afraid - awful might happen 0 0 0 0  Total GAD 7 Score 0 4 2 0  Anxiety Difficulty Not difficult at all Not difficult at all Not difficult at all         07/08/2022    1:33 PM 11/08/2021   10:13 AM 09/19/2020   11:37 AM  Depression screen PHQ 2/9  Decreased Interest 0 0 0  Down, Depressed, Hopeless 0 0 0  PHQ - 2 Score 0 0 0  Altered sleeping 0 2 0  Tired, decreased energy 0 1 0  Change in appetite 0 0 0  Feeling bad or failure about yourself  0 0 0  Trouble concentrating 0 0 0  Moving slowly or fidgety/restless 0 0 0  Suicidal thoughts 0 0 0  PHQ-9 Score 0 3 0  Difficult doing work/chores Not difficult at all Somewhat difficult     BP Readings from Last 3 Encounters:  07/08/22 120/66  12/06/21 94/69  11/08/21 120/70    Physical Exam Vitals and nursing note reviewed. Exam conducted with a chaperone present.  Constitutional:      General: She is not in acute distress.    Appearance: She is not diaphoretic.  HENT:     Head: Normocephalic and atraumatic.     Right Ear: Tympanic membrane and external ear normal.     Left Ear: Tympanic membrane and external ear normal.     Nose: Nose normal. No congestion or rhinorrhea.     Mouth/Throat:     Mouth: Mucous membranes are moist.  Eyes:     General:        Right eye: No discharge.        Left eye: No discharge.     Conjunctiva/sclera: Conjunctivae normal.     Pupils: Pupils are equal, round, and reactive to light.  Neck:     Thyroid: No thyromegaly.     Vascular: No JVD.  Cardiovascular:     Rate and Rhythm: Normal rate and regular rhythm.     Heart sounds: Normal heart sounds. No murmur heard.    No friction rub. No gallop.  Pulmonary:     Effort: Pulmonary effort is normal. No respiratory distress.     Breath sounds: Normal breath sounds. No stridor. No wheezing, rhonchi or rales.  Chest:     Chest wall: No tenderness.  Abdominal:     General: Bowel sounds are normal.     Palpations: Abdomen is soft. There is no mass.     Tenderness: There is no abdominal tenderness. There is no guarding or rebound.     Hernia: No hernia is present.  Musculoskeletal:        General:  Normal range of motion.     Cervical back: Normal range of motion and neck supple.  Lymphadenopathy:     Cervical: No cervical adenopathy.  Skin:    General: Skin is warm and  dry.  Neurological:     Mental Status: She is alert.     Deep Tendon Reflexes: Reflexes are normal and symmetric.     Wt Readings from Last 3 Encounters:  07/08/22 168 lb (76.2 kg)  12/06/21 158 lb (71.7 kg)  11/08/21 158 lb (71.7 kg)    BP 120/66   Pulse 66   Ht '5\' 5"'$  (1.651 m)   Wt 168 lb (76.2 kg)   SpO2 98%   BMI 27.96 kg/m   Assessment and Plan:  1. Alopecia Chronic.  Episodic.  Patient is treated hair loss condition with Rogaine in the past/solution/brand name/2% and then started having some sores and some scaly areas on the scalp which she thought was due to the Rogaine but she had switched to a different brand generic and increase the percent to 5% of Rogaine and also use some foam as well I pointed out it may be the Rogaine or the increase in the intensity or the vehicle in which it was being delivered and given the manual fracture.  Other things of concern would be if there is an alopecia areata/increased testosterone for which we will look at testosterone/hypothyroidism we will check thyroid panel with TSH or when the breaking out was occurring whether there was some other underlying dermatologic problems such as seborrhea or psoriatic condition given there is some nail changes as well - Thyroid Panel With TSH - Testosterone  2. Nail abnormality No changes that may be consistent with pitting may be psoriatic in nature and will refer to dermatology. - Thyroid Panel With TSH - Testosterone    Otilio Miu, MD

## 2022-07-09 ENCOUNTER — Encounter: Payer: Self-pay | Admitting: Family Medicine

## 2022-07-09 LAB — THYROID PANEL WITH TSH
Free Thyroxine Index: 1.5 (ref 1.2–4.9)
T3 Uptake Ratio: 28 % (ref 24–39)
T4, Total: 5.4 ug/dL (ref 4.5–12.0)
TSH: 1.54 u[IU]/mL (ref 0.450–4.500)

## 2022-07-09 LAB — TESTOSTERONE: Testosterone: <3 ng/dL — ABNORMAL LOW (ref 4–50)

## 2022-07-12 DIAGNOSIS — L659 Nonscarring hair loss, unspecified: Secondary | ICD-10-CM | POA: Diagnosis not present

## 2022-07-24 DIAGNOSIS — D225 Melanocytic nevi of trunk: Secondary | ICD-10-CM | POA: Diagnosis not present

## 2022-07-24 DIAGNOSIS — L2089 Other atopic dermatitis: Secondary | ICD-10-CM | POA: Diagnosis not present

## 2022-07-24 DIAGNOSIS — L578 Other skin changes due to chronic exposure to nonionizing radiation: Secondary | ICD-10-CM | POA: Diagnosis not present

## 2022-07-24 DIAGNOSIS — L218 Other seborrheic dermatitis: Secondary | ICD-10-CM | POA: Diagnosis not present

## 2022-08-01 DIAGNOSIS — L650 Telogen effluvium: Secondary | ICD-10-CM | POA: Diagnosis not present

## 2022-08-01 DIAGNOSIS — L658 Other specified nonscarring hair loss: Secondary | ICD-10-CM | POA: Diagnosis not present

## 2022-08-01 DIAGNOSIS — L218 Other seborrheic dermatitis: Secondary | ICD-10-CM | POA: Diagnosis not present

## 2022-10-13 IMAGING — CR DG CERVICAL SPINE COMPLETE 4+V
6 series · 6 of 6 positions shown · non-contrast
Comparison: None Available.

CLINICAL DATA: Neck pain

EXAM:
CERVICAL SPINE - COMPLETE 4+ VIEW

[c-spine lat]
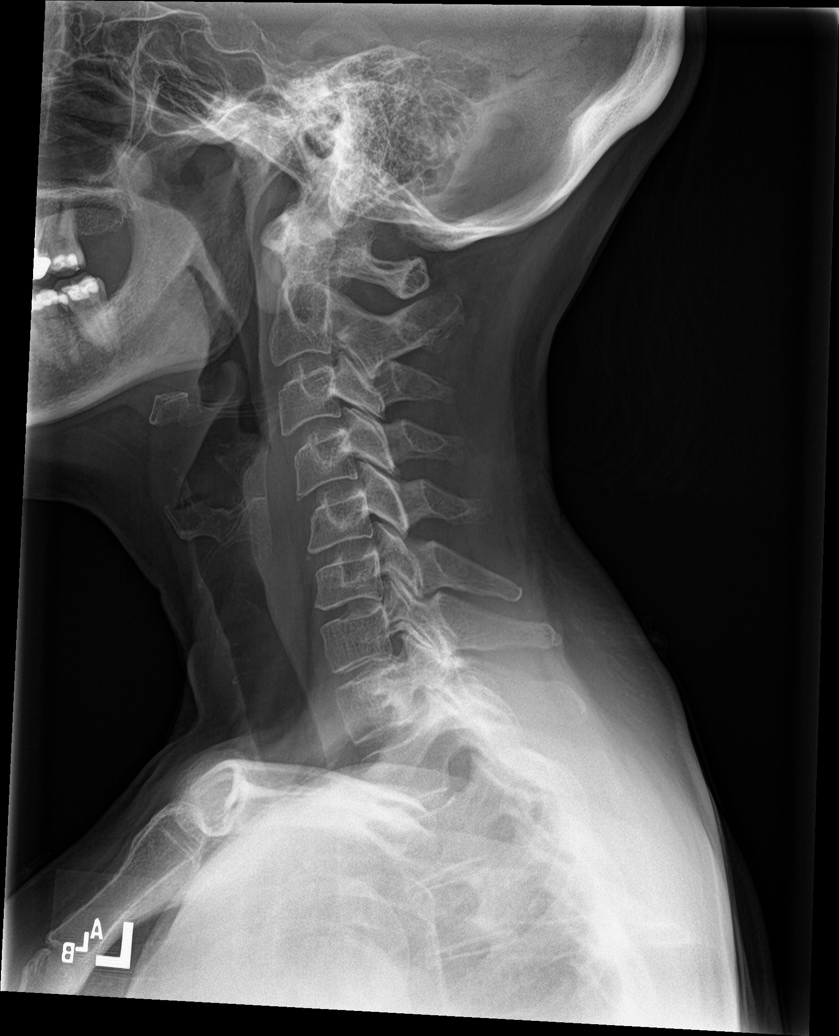

[c-spine obl (1 of 2)]
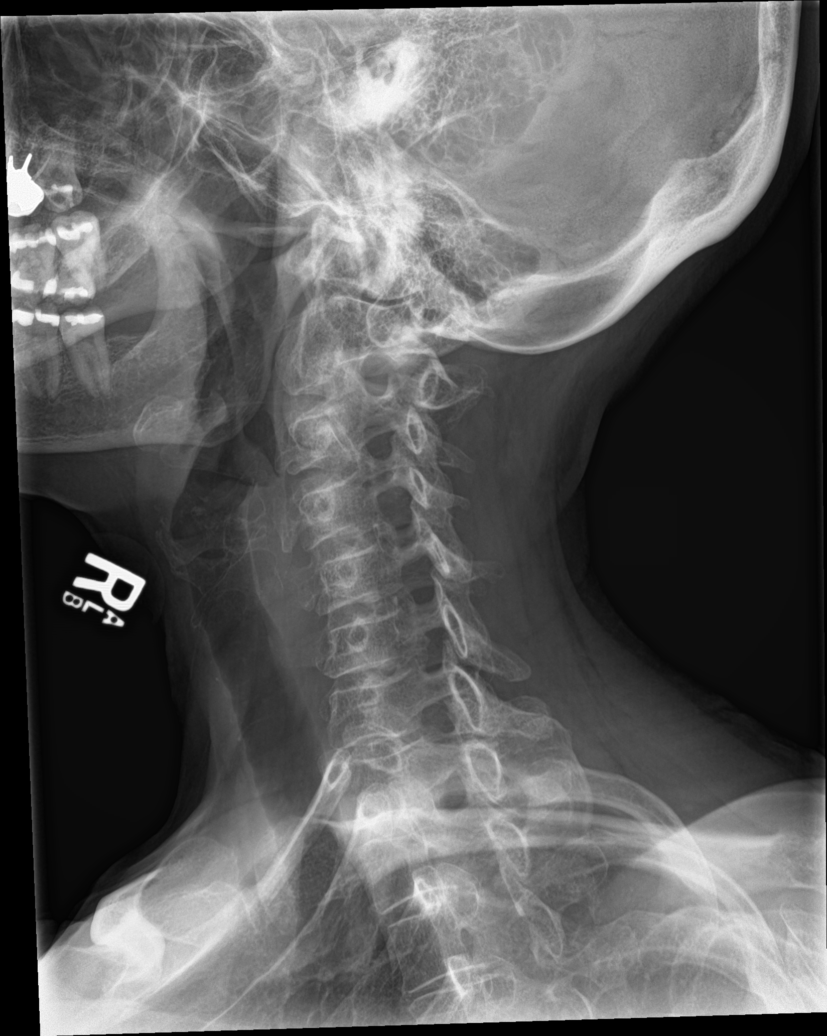

[c-spine obl (2 of 2)]
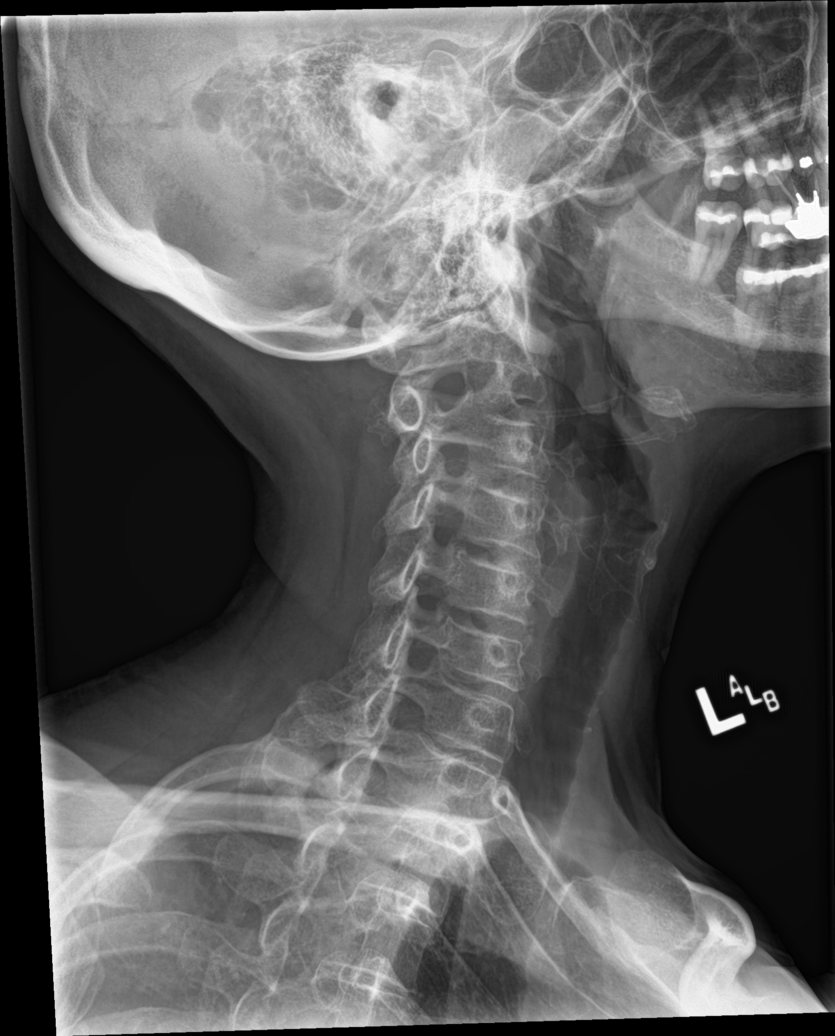

[c-spine ap]
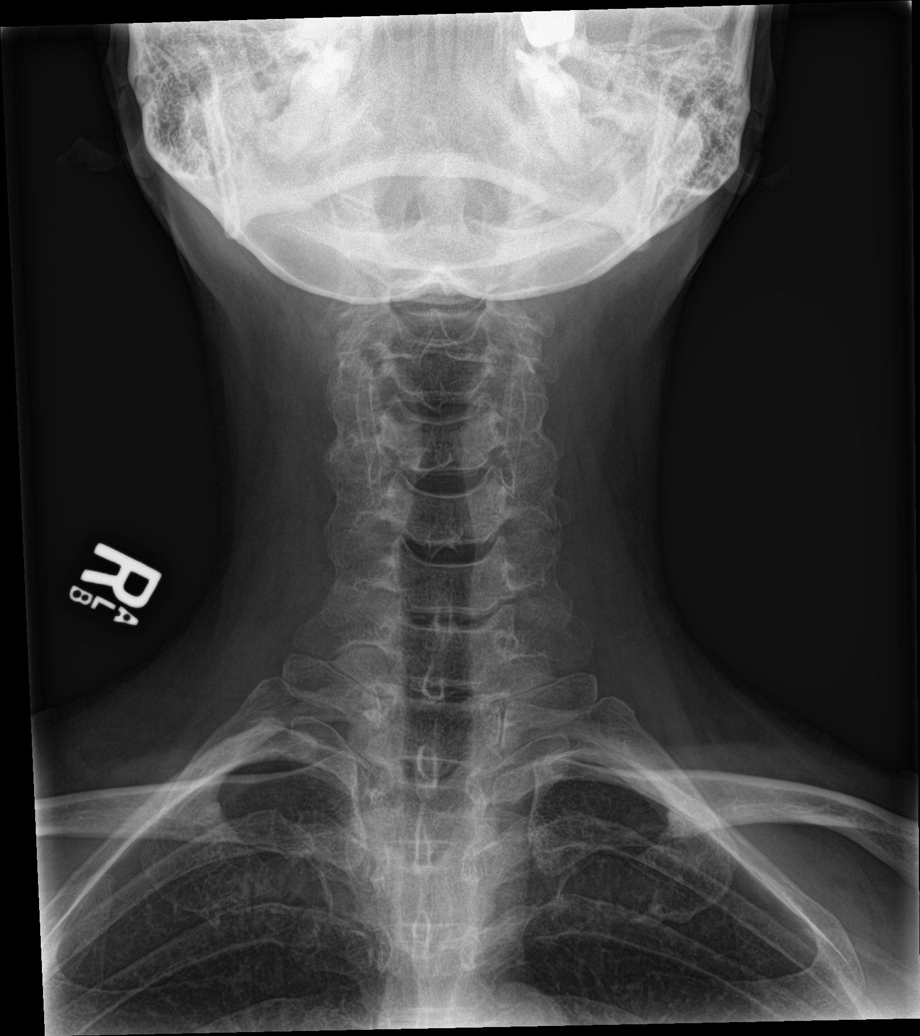

[c-spine open mouth]
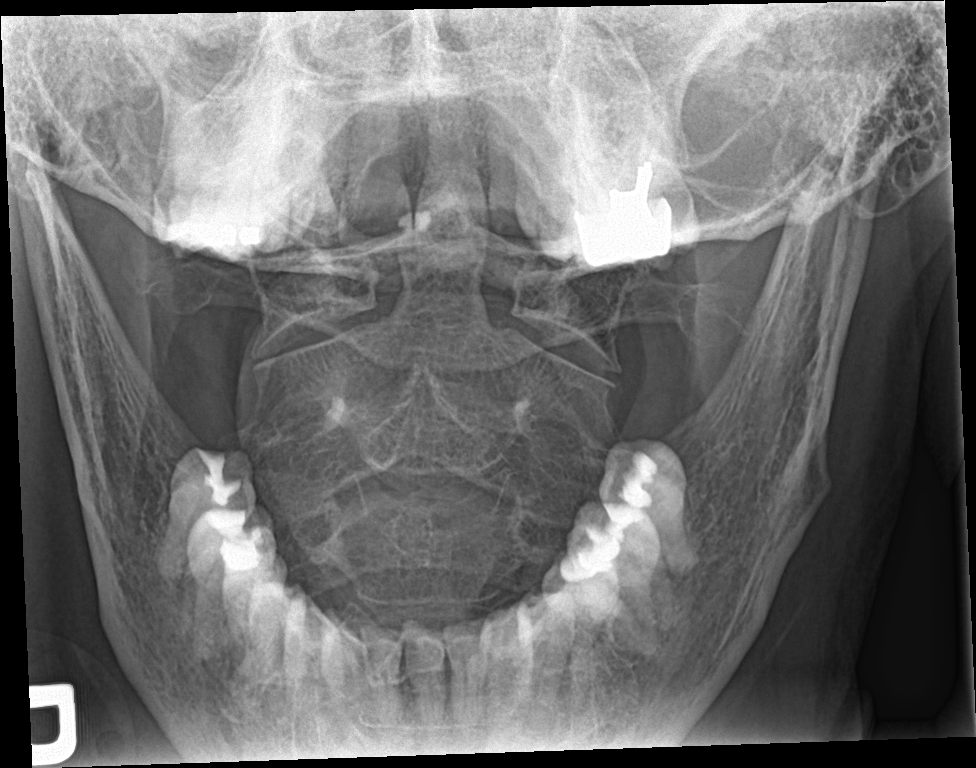

[[person_name]]
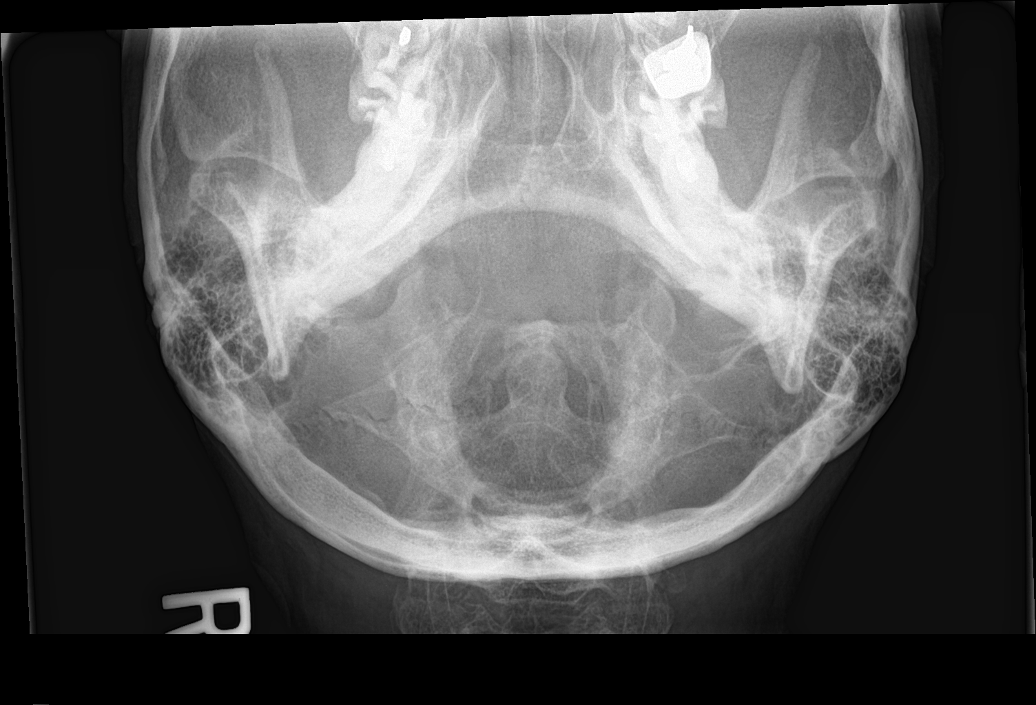

[6 of 6 positions shown; findings below may reference images not displayed]

FINDINGS: Mild reversal of cervical lordosis. Vertebral body heights are
maintained. Mild disc space narrowing C6-C7 and C7-T1. Foramen are
grossly patent. The dens and lateral masses are within normal
limits.
IMPRESSION: Mild reversal of cervical lordosis with mild degenerative change C6
through T1

## 2023-02-21 DIAGNOSIS — R9230 Dense breasts, unspecified: Secondary | ICD-10-CM | POA: Diagnosis not present

## 2023-02-21 DIAGNOSIS — Z1231 Encounter for screening mammogram for malignant neoplasm of breast: Secondary | ICD-10-CM | POA: Diagnosis not present

## 2023-02-21 DIAGNOSIS — Z803 Family history of malignant neoplasm of breast: Secondary | ICD-10-CM | POA: Diagnosis not present

## 2023-03-22 DIAGNOSIS — Z23 Encounter for immunization: Secondary | ICD-10-CM | POA: Diagnosis not present

## 2023-04-25 ENCOUNTER — Ambulatory Visit (INDEPENDENT_AMBULATORY_CARE_PROVIDER_SITE_OTHER): Payer: BC Managed Care – PPO | Admitting: Family Medicine

## 2023-04-25 ENCOUNTER — Encounter: Payer: Self-pay | Admitting: Family Medicine

## 2023-04-25 VITALS — BP 100/62 | HR 80 | Ht 65.0 in | Wt 167.0 lb

## 2023-04-25 DIAGNOSIS — Z0000 Encounter for general adult medical examination without abnormal findings: Secondary | ICD-10-CM | POA: Diagnosis not present

## 2023-04-25 DIAGNOSIS — E785 Hyperlipidemia, unspecified: Secondary | ICD-10-CM

## 2023-04-25 DIAGNOSIS — R5383 Other fatigue: Secondary | ICD-10-CM

## 2023-04-25 NOTE — Patient Instructions (Signed)

## 2023-04-25 NOTE — Progress Notes (Signed)
Date:  04/25/2023   Name:  Mallory Weber   DOB:  03/11/1966   MRN:  [id number]   Chief Complaint: Establish Care  Patient is a 57 year old female who presents for a comprehensive physical exam. The patient reports the following problems: none. Health maintenance has been reviewed up to date.     Lab Results  Component Value Date   NA 141 11/08/2021   K 4.6 11/08/2021   CO2 25 11/08/2021   GLUCOSE 114 (H) 11/08/2021   BUN 17 11/08/2021   CREATININE 0.79 11/08/2021   CALCIUM 9.3 11/08/2021   EGFR 87 11/08/2021   GFRNONAA 85 10/29/2019   Lab Results  Component Value Date   CHOL 225 (H) 11/08/2021   HDL 51 11/08/2021   LDLCALC 151 (H) 11/08/2021   TRIG 127 11/08/2021   CHOLHDL 4.0 03/06/2017   Lab Results  Component Value Date   TSH 1.540 07/08/2022   No results found for: "HGBA1C" Lab Results  Component Value Date   WBC 4.3 11/08/2021   HGB 13.6 11/08/2021   HCT 39.8 11/08/2021   MCV 92 11/08/2021   PLT 200 11/08/2021   Lab Results  Component Value Date   ALT 11 11/08/2021   AST 16 11/08/2021   ALKPHOS 56 11/08/2021   BILITOT 0.4 11/08/2021   No results found for: "25OHVITD2", "25OHVITD3", "VD25OH"   Review of Systems  Constitutional:  Negative for chills and fever.  HENT:  Negative for drooling, ear discharge, ear pain and sore throat.   Respiratory:  Negative for cough, shortness of breath and wheezing.   Cardiovascular:  Negative for chest pain, palpitations and leg swelling.  Gastrointestinal:  Negative for abdominal pain, blood in stool, constipation, diarrhea and nausea.  Endocrine: Negative for polydipsia.  Genitourinary:  Negative for dysuria, frequency, hematuria and urgency.  Musculoskeletal:  Negative for back pain, myalgias and neck pain.  Skin:  Negative for rash.  Allergic/Immunologic: Negative for environmental allergies.  Neurological:  Negative for dizziness and headaches.  Hematological:  Does not bruise/bleed easily.   Psychiatric/Behavioral:  Negative for suicidal ideas. The patient is not nervous/anxious.     Patient Active Problem List   Diagnosis Date Noted   Encounter for screening colonoscopy    Enthesopathy of right hip 04/25/2021   H/O abdominal hysterectomy 09/20/2020   Family hx-breast malignancy 07/16/2018   Postop check 02/15/2016   Postmenopausal bleeding 07/20/2015   Abnormal mammogram 10/22/2012    Allergies  Allergen Reactions   Iodinated Contrast Media Hives   Cat Hair Extract Itching and Other (See Comments)    Also sneezing & eye irritation    Past Surgical History:  Procedure Laterality Date   BREAST CYST EXCISION Bilateral    COLONOSCOPY WITH PROPOFOL N/A 12/06/2021   Procedure: COLONOSCOPY WITH PROPOFOL;  Surgeon: Midge Minium, MD;  Location: [facility];  Service: Endoscopy;  Laterality: N/A;    Social History   Tobacco Use   Smoking status: Never   Smokeless tobacco: Never  Vaping Use   Vaping status: Never Used  Substance Use Topics   Alcohol use: Yes    Alcohol/week: 8.0 standard drinks of alcohol    Types: 8 Glasses of wine per week   Drug use: No     Medication list has been reviewed and updated.  Current Meds  Medication Sig   acetaminophen (TYLENOL) 500 MG tablet Take 500 mg by mouth daily.   BIOTIN PO Take by mouth daily.   Calcium  Carb-Cholecalciferol (CALCIUM 1000 + D PO) Take 1 capsule by mouth daily.   Multiple Vitamins-Minerals (PRESERVISION AREDS 2) CAPS Take 1 capsule by mouth daily.       04/25/2023    9:17 AM 07/08/2022    1:33 PM 11/08/2021   10:13 AM 09/19/2020   11:38 AM  GAD 7 : Generalized Anxiety Score  Nervous, Anxious, on Edge 0 0 1 0  Control/stop worrying 0 0 1 1  Worry too much - different things 0 0 1 1  Trouble relaxing 0 0 0 0  Restless 0 0 0 0  Easily annoyed or irritable 1 0 1 0  Afraid - awful might happen 0 0 0 0  Total GAD 7 Score 1 0 4 2  Anxiety Difficulty Not difficult at all Not difficult at all  Not difficult at all Not difficult at all       04/25/2023    9:17 AM 07/08/2022    1:33 PM 11/08/2021   10:13 AM  Depression screen PHQ 2/9  Decreased Interest 0 0 0  Down, Depressed, Hopeless 0 0 0  PHQ - 2 Score 0 0 0  Altered sleeping 0 0 2  Tired, decreased energy 0 0 1  Change in appetite 0 0 0  Feeling bad or failure about yourself  0 0 0  Trouble concentrating 0 0 0  Moving slowly or fidgety/restless 0 0 0  Suicidal thoughts 0 0 0  PHQ-9 Score 0 0 3  Difficult doing work/chores Not difficult at all Not difficult at all Somewhat difficult    BP Readings from Last 3 Encounters:  04/25/23 100/62  07/08/22 120/66  12/06/21 94/69    Physical Exam Vitals and nursing note reviewed.  Constitutional:      General: She is not in acute distress.    Appearance: She is not diaphoretic.  HENT:     Head: Normocephalic and atraumatic.     Jaw: There is normal jaw occlusion.     Right Ear: Hearing, tympanic membrane, ear canal and external ear normal.     Left Ear: Hearing, tympanic membrane, ear canal and external ear normal.     Nose: Nose normal.     Mouth/Throat:     Lips: Pink.     Mouth: Mucous membranes are moist. No oral lesions.     Dentition: Normal dentition.     Tongue: No lesions.     Palate: No mass and lesions.     Pharynx: Oropharynx is clear. Uvula midline.     Tonsils: No tonsillar exudate.  Eyes:     General: Lids are normal. Vision grossly intact. Gaze aligned appropriately.        Right eye: No discharge.        Left eye: No discharge.     Conjunctiva/sclera: Conjunctivae normal.     Pupils: Pupils are equal, round, and reactive to light.     Funduscopic exam:    Right eye: Red reflex present.        Left eye: Red reflex present. Neck:     Thyroid: No thyroid mass, thyromegaly or thyroid tenderness.     Vascular: Normal carotid pulses. No carotid bruit, hepatojugular reflux or JVD.     Trachea: Trachea normal.  Cardiovascular:     Rate and  Rhythm: Normal rate and regular rhythm.     Pulses: Normal pulses.          Carotid pulses are 2+ on the right side and 2+  on the left side.      Radial pulses are 2+ on the right side and 2+ on the left side.       Femoral pulses are 2+ on the right side and 2+ on the left side.      Popliteal pulses are 2+ on the right side and 2+ on the left side.       Dorsalis pedis pulses are 2+ on the right side and 2+ on the left side.       Posterior tibial pulses are 2+ on the right side and 2+ on the left side.     Heart sounds: Normal heart sounds, S1 normal and S2 normal. No murmur heard.    No systolic murmur is present.     No diastolic murmur is present.     No friction rub. No gallop. No S3 or S4 sounds.  Pulmonary:     Effort: Pulmonary effort is normal.     Breath sounds: Normal breath sounds. No decreased breath sounds, wheezing, rhonchi or rales.  Chest:     Comments: Breast exam deferred by patient Abdominal:     General: Bowel sounds are normal.     Palpations: Abdomen is soft. There is no hepatomegaly, splenomegaly or mass.     Tenderness: There is no abdominal tenderness. There is no guarding.  Genitourinary:    Comments: Rectal deferred by patient Musculoskeletal:        General: Normal range of motion.     Cervical back: Full passive range of motion without pain, normal range of motion and neck supple.     Right lower leg: No edema.     Left lower leg: No edema.  Lymphadenopathy:     Head:     Right side of head: No submental or submandibular adenopathy.     Left side of head: No submental or submandibular adenopathy.     Cervical: No cervical adenopathy.     Right cervical: No superficial, deep or posterior cervical adenopathy.    Left cervical: No superficial, deep or posterior cervical adenopathy.     Upper Body:     Right upper body: No supraclavicular adenopathy.     Left upper body: No supraclavicular adenopathy.  Skin:    General: Skin is warm and dry.      Capillary Refill: Capillary refill takes less than 2 seconds.  Neurological:     General: No focal deficit present.     Mental Status: She is alert.     Cranial Nerves: Cranial nerves 2-12 are intact.     Sensory: Sensation is intact.     Deep Tendon Reflexes: Reflexes are normal and symmetric.  Psychiatric:        Behavior: Behavior is cooperative.    Wt Readings from Last 3 Encounters:  04/25/23 167 lb (75.8 kg)  07/08/22 168 lb (76.2 kg)  12/06/21 158 lb (71.7 kg)    BP 100/62   Pulse 80   Ht 5\' 5"  (1.651 m)   Wt 167 lb (75.8 kg)   SpO2 97%   BMI 27.79 kg/m   Assessment and Plan:  TIANNE ENCISO is a 57 y.o. female who presents today for her Complete Annual Exam. She feels well. She reports exercising . She reports she is sleeping well. Immunizations are reviewed and recommendations provided.   Age appropriate screening tests are discussed. Counseling given for risk factor reduction interventions.   1. Annual physical exam No subjective/objective concerns noted during HPI,  review of past medical history/review of medications/review of previous labs/, review of systems and physical exam.  Will obtain CMP lipid panel CBC for baseline labs for physical. - Comprehensive metabolic panel - Lipid Panel With LDL/HDL Ratio - CBC with Differential/Platelet  2. Hyperlipidemia, unspecified hyperlipidemia type Chronic.  Controlled.  Patient is currently using dietary approach and has been given low-cholesterol low triglyceride dietary guidelines will check lipid panel for current level of LDL control. - Lipid Panel With LDL/HDL Ratio  3. Fatigue, unspecified type Patient's been having some fatigue we will we will proceed with evaluation labs with CBC CMP and lipid.  Elizabeth Sauer, MD

## 2023-04-26 ENCOUNTER — Encounter: Payer: Self-pay | Admitting: Family Medicine

## 2023-04-26 DIAGNOSIS — H109 Unspecified conjunctivitis: Secondary | ICD-10-CM | POA: Diagnosis not present

## 2023-04-26 LAB — COMPREHENSIVE METABOLIC PANEL
ALT: 16 [IU]/L (ref 0–32)
AST: 18 [IU]/L (ref 0–40)
Albumin: 4.2 g/dL (ref 3.8–4.9)
Alkaline Phosphatase: 59 [IU]/L (ref 44–121)
BUN/Creatinine Ratio: 26 — ABNORMAL HIGH (ref 9–23)
BUN: 21 mg/dL (ref 6–24)
Bilirubin Total: 0.3 mg/dL (ref 0.0–1.2)
CO2: 24 mmol/L (ref 20–29)
Calcium: 9.2 mg/dL (ref 8.7–10.2)
Chloride: 105 mmol/L (ref 96–106)
Creatinine, Ser: 0.8 mg/dL (ref 0.57–1.00)
Globulin, Total: 2.1 g/dL (ref 1.5–4.5)
Glucose: 100 mg/dL — ABNORMAL HIGH (ref 70–99)
Potassium: 4.7 mmol/L (ref 3.5–5.2)
Sodium: 142 mmol/L (ref 134–144)
Total Protein: 6.3 g/dL (ref 6.0–8.5)
eGFR: 85 mL/min/{1.73_m2} (ref >59)

## 2023-04-26 LAB — CBC WITH DIFFERENTIAL/PLATELET
Basophils Absolute: 0 10*3/uL (ref 0.0–0.2)
Basos: 1 %
EOS (ABSOLUTE): 0.1 10*3/uL (ref 0.0–0.4)
Eos: 3 %
Hematocrit: 38.5 % (ref 34.0–46.6)
Hemoglobin: 12.3 g/dL (ref 11.1–15.9)
Immature Grans (Abs): 0 10*3/uL (ref 0.0–0.1)
Immature Granulocytes: 0 %
Lymphocytes Absolute: 1.1 10*3/uL (ref 0.7–3.1)
Lymphs: 29 %
MCH: 30.4 pg (ref 26.6–33.0)
MCHC: 31.9 g/dL (ref 31.5–35.7)
MCV: 95 fL (ref 79–97)
Monocytes Absolute: 0.4 10*3/uL (ref 0.1–0.9)
Monocytes: 10 %
Neutrophils Absolute: 2.1 10*3/uL (ref 1.4–7.0)
Neutrophils: 57 %
Platelets: 196 10*3/uL (ref 150–450)
RBC: 4.04 x10E6/uL (ref 3.77–5.28)
RDW: 12.8 % (ref 11.7–15.4)
WBC: 3.7 10*3/uL (ref 3.4–10.8)

## 2023-04-26 LAB — LIPID PANEL WITH LDL/HDL RATIO
Cholesterol, Total: 207 mg/dL — ABNORMAL HIGH (ref 100–199)
HDL: 45 mg/dL (ref >39)
LDL Chol Calc (NIH): 144 mg/dL — ABNORMAL HIGH (ref 0–99)
LDL/HDL Ratio: 3.2 ratio (ref 0.0–3.2)
Triglycerides: 98 mg/dL (ref 0–149)
VLDL Cholesterol Cal: 18 mg/dL (ref 5–40)

## 2023-05-12 DIAGNOSIS — Z83518 Family history of other specified eye disorder: Secondary | ICD-10-CM | POA: Diagnosis not present

## 2023-05-12 DIAGNOSIS — H526 Other disorders of refraction: Secondary | ICD-10-CM | POA: Diagnosis not present

## 2023-05-22 DIAGNOSIS — L910 Hypertrophic scar: Secondary | ICD-10-CM | POA: Diagnosis not present

## 2023-05-22 DIAGNOSIS — M722 Plantar fascial fibromatosis: Secondary | ICD-10-CM | POA: Diagnosis not present

## 2023-05-22 DIAGNOSIS — L821 Other seborrheic keratosis: Secondary | ICD-10-CM | POA: Diagnosis not present

## 2023-05-22 DIAGNOSIS — M25571 Pain in right ankle and joints of right foot: Secondary | ICD-10-CM | POA: Diagnosis not present

## 2023-05-26 DIAGNOSIS — J020 Streptococcal pharyngitis: Secondary | ICD-10-CM | POA: Diagnosis not present

## 2023-05-27 ENCOUNTER — Ambulatory Visit: Payer: BC Managed Care – PPO | Admitting: Family Medicine

## 2023-06-19 DIAGNOSIS — M722 Plantar fascial fibromatosis: Secondary | ICD-10-CM | POA: Diagnosis not present

## 2023-06-20 DIAGNOSIS — L570 Actinic keratosis: Secondary | ICD-10-CM | POA: Diagnosis not present

## 2023-06-20 DIAGNOSIS — X32XXXA Exposure to sunlight, initial encounter: Secondary | ICD-10-CM | POA: Diagnosis not present

## 2023-06-20 DIAGNOSIS — L720 Epidermal cyst: Secondary | ICD-10-CM | POA: Diagnosis not present

## 2023-07-21 DIAGNOSIS — M722 Plantar fascial fibromatosis: Secondary | ICD-10-CM | POA: Diagnosis not present

## 2023-07-29 DIAGNOSIS — D225 Melanocytic nevi of trunk: Secondary | ICD-10-CM | POA: Diagnosis not present

## 2023-07-29 DIAGNOSIS — L821 Other seborrheic keratosis: Secondary | ICD-10-CM | POA: Diagnosis not present

## 2023-07-29 DIAGNOSIS — L720 Epidermal cyst: Secondary | ICD-10-CM | POA: Diagnosis not present

## 2023-07-29 DIAGNOSIS — L244 Irritant contact dermatitis due to drugs in contact with skin: Secondary | ICD-10-CM | POA: Diagnosis not present

## 2023-08-18 DIAGNOSIS — M722 Plantar fascial fibromatosis: Secondary | ICD-10-CM | POA: Diagnosis not present

## 2023-08-25 DIAGNOSIS — M722 Plantar fascial fibromatosis: Secondary | ICD-10-CM | POA: Diagnosis not present

## 2023-08-28 DIAGNOSIS — M79672 Pain in left foot: Secondary | ICD-10-CM | POA: Diagnosis not present

## 2023-08-28 DIAGNOSIS — M5416 Radiculopathy, lumbar region: Secondary | ICD-10-CM | POA: Diagnosis not present

## 2023-08-29 DIAGNOSIS — M84372A Stress fracture, left ankle, initial encounter for fracture: Secondary | ICD-10-CM | POA: Diagnosis not present

## 2023-08-29 DIAGNOSIS — M722 Plantar fascial fibromatosis: Secondary | ICD-10-CM | POA: Diagnosis not present

## 2023-09-22 DIAGNOSIS — M5416 Radiculopathy, lumbar region: Secondary | ICD-10-CM | POA: Diagnosis not present

## 2023-09-22 DIAGNOSIS — M79672 Pain in left foot: Secondary | ICD-10-CM | POA: Diagnosis not present

## 2023-09-22 DIAGNOSIS — M545 Low back pain, unspecified: Secondary | ICD-10-CM | POA: Diagnosis not present

## 2023-10-06 DIAGNOSIS — M545 Low back pain, unspecified: Secondary | ICD-10-CM | POA: Diagnosis not present

## 2023-10-06 DIAGNOSIS — M79672 Pain in left foot: Secondary | ICD-10-CM | POA: Diagnosis not present

## 2023-10-06 DIAGNOSIS — M5416 Radiculopathy, lumbar region: Secondary | ICD-10-CM | POA: Diagnosis not present

## 2023-10-10 DIAGNOSIS — M722 Plantar fascial fibromatosis: Secondary | ICD-10-CM | POA: Diagnosis not present

## 2023-10-10 DIAGNOSIS — M84372A Stress fracture, left ankle, initial encounter for fracture: Secondary | ICD-10-CM | POA: Diagnosis not present

## 2023-10-13 DIAGNOSIS — M79672 Pain in left foot: Secondary | ICD-10-CM | POA: Diagnosis not present

## 2023-10-13 DIAGNOSIS — M5416 Radiculopathy, lumbar region: Secondary | ICD-10-CM | POA: Diagnosis not present

## 2023-10-13 DIAGNOSIS — M545 Low back pain, unspecified: Secondary | ICD-10-CM | POA: Diagnosis not present

## 2023-10-20 DIAGNOSIS — M545 Low back pain, unspecified: Secondary | ICD-10-CM | POA: Diagnosis not present

## 2023-10-20 DIAGNOSIS — M79672 Pain in left foot: Secondary | ICD-10-CM | POA: Diagnosis not present

## 2023-10-20 DIAGNOSIS — M5416 Radiculopathy, lumbar region: Secondary | ICD-10-CM | POA: Diagnosis not present

## 2023-10-27 DIAGNOSIS — M545 Low back pain, unspecified: Secondary | ICD-10-CM | POA: Diagnosis not present

## 2023-10-27 DIAGNOSIS — M79672 Pain in left foot: Secondary | ICD-10-CM | POA: Diagnosis not present

## 2023-10-27 DIAGNOSIS — M5416 Radiculopathy, lumbar region: Secondary | ICD-10-CM | POA: Diagnosis not present

## 2023-11-07 DIAGNOSIS — M722 Plantar fascial fibromatosis: Secondary | ICD-10-CM | POA: Diagnosis not present

## 2023-11-10 DIAGNOSIS — M5416 Radiculopathy, lumbar region: Secondary | ICD-10-CM | POA: Diagnosis not present

## 2023-11-10 DIAGNOSIS — M545 Low back pain, unspecified: Secondary | ICD-10-CM | POA: Diagnosis not present

## 2023-11-10 DIAGNOSIS — M79672 Pain in left foot: Secondary | ICD-10-CM | POA: Diagnosis not present

## 2023-11-25 DIAGNOSIS — Z803 Family history of malignant neoplasm of breast: Secondary | ICD-10-CM | POA: Diagnosis not present

## 2023-11-25 DIAGNOSIS — M722 Plantar fascial fibromatosis: Secondary | ICD-10-CM | POA: Diagnosis not present

## 2023-11-25 DIAGNOSIS — E785 Hyperlipidemia, unspecified: Secondary | ICD-10-CM | POA: Diagnosis not present

## 2023-11-25 DIAGNOSIS — Z23 Encounter for immunization: Secondary | ICD-10-CM | POA: Diagnosis not present

## 2023-11-25 DIAGNOSIS — M8580 Other specified disorders of bone density and structure, unspecified site: Secondary | ICD-10-CM | POA: Diagnosis not present

## 2023-11-28 DIAGNOSIS — M722 Plantar fascial fibromatosis: Secondary | ICD-10-CM | POA: Diagnosis not present

## 2023-12-08 DIAGNOSIS — M545 Low back pain, unspecified: Secondary | ICD-10-CM | POA: Diagnosis not present

## 2023-12-08 DIAGNOSIS — M5416 Radiculopathy, lumbar region: Secondary | ICD-10-CM | POA: Diagnosis not present

## 2023-12-08 DIAGNOSIS — M79672 Pain in left foot: Secondary | ICD-10-CM | POA: Diagnosis not present

## 2024-01-08 DIAGNOSIS — M5416 Radiculopathy, lumbar region: Secondary | ICD-10-CM | POA: Diagnosis not present

## 2024-01-08 DIAGNOSIS — M79672 Pain in left foot: Secondary | ICD-10-CM | POA: Diagnosis not present

## 2024-01-08 DIAGNOSIS — M545 Low back pain, unspecified: Secondary | ICD-10-CM | POA: Diagnosis not present

## 2024-01-15 DIAGNOSIS — Z23 Encounter for immunization: Secondary | ICD-10-CM | POA: Diagnosis not present

## 2024-02-12 DIAGNOSIS — Z780 Asymptomatic menopausal state: Secondary | ICD-10-CM | POA: Diagnosis not present

## 2024-02-12 DIAGNOSIS — M8588 Other specified disorders of bone density and structure, other site: Secondary | ICD-10-CM | POA: Diagnosis not present

## 2024-02-26 DIAGNOSIS — Z9189 Other specified personal risk factors, not elsewhere classified: Secondary | ICD-10-CM | POA: Diagnosis not present

## 2024-02-26 DIAGNOSIS — Z1231 Encounter for screening mammogram for malignant neoplasm of breast: Secondary | ICD-10-CM | POA: Diagnosis not present

## 2024-02-26 DIAGNOSIS — Z803 Family history of malignant neoplasm of breast: Secondary | ICD-10-CM | POA: Diagnosis not present

## 2024-03-16 DIAGNOSIS — R519 Headache, unspecified: Secondary | ICD-10-CM | POA: Diagnosis not present

## 2024-03-16 DIAGNOSIS — M542 Cervicalgia: Secondary | ICD-10-CM | POA: Diagnosis not present

## 2024-03-16 DIAGNOSIS — M26602 Left temporomandibular joint disorder, unspecified: Secondary | ICD-10-CM | POA: Diagnosis not present

## 2024-03-31 DIAGNOSIS — R921 Mammographic calcification found on diagnostic imaging of breast: Secondary | ICD-10-CM | POA: Diagnosis not present

## 2024-03-31 DIAGNOSIS — Z803 Family history of malignant neoplasm of breast: Secondary | ICD-10-CM | POA: Diagnosis not present

## 2024-03-31 DIAGNOSIS — R928 Other abnormal and inconclusive findings on diagnostic imaging of breast: Secondary | ICD-10-CM | POA: Diagnosis not present

## 2024-04-21 DIAGNOSIS — M8580 Other specified disorders of bone density and structure, unspecified site: Secondary | ICD-10-CM | POA: Diagnosis not present

## 2024-04-21 DIAGNOSIS — Z131 Encounter for screening for diabetes mellitus: Secondary | ICD-10-CM | POA: Diagnosis not present

## 2024-04-21 DIAGNOSIS — R519 Headache, unspecified: Secondary | ICD-10-CM | POA: Diagnosis not present

## 2024-04-21 DIAGNOSIS — L659 Nonscarring hair loss, unspecified: Secondary | ICD-10-CM | POA: Diagnosis not present

## 2024-04-21 DIAGNOSIS — E785 Hyperlipidemia, unspecified: Secondary | ICD-10-CM | POA: Diagnosis not present

## 2024-04-27 DIAGNOSIS — Z0001 Encounter for general adult medical examination with abnormal findings: Secondary | ICD-10-CM | POA: Diagnosis not present

## 2024-04-27 DIAGNOSIS — Z23 Encounter for immunization: Secondary | ICD-10-CM | POA: Diagnosis not present

## 2024-04-27 DIAGNOSIS — E785 Hyperlipidemia, unspecified: Secondary | ICD-10-CM | POA: Diagnosis not present

## 2024-04-27 DIAGNOSIS — M8580 Other specified disorders of bone density and structure, unspecified site: Secondary | ICD-10-CM | POA: Diagnosis not present

## 2024-04-27 DIAGNOSIS — K648 Other hemorrhoids: Secondary | ICD-10-CM | POA: Diagnosis not present
